# Patient Record
Sex: Male | Born: 1990 | Race: White | Hispanic: No | Marital: Single | Smoking: Current every day smoker
Health system: Southern US, Community
[De-identification: ages and names within clinical notes are randomized; demographics above are authoritative.]

## PROBLEM LIST (undated history)

## (undated) ENCOUNTER — Ambulatory Visit: Admission: EM | Source: Ambulatory Visit

## (undated) DIAGNOSIS — N200 Calculus of kidney: Secondary | ICD-10-CM

## (undated) HISTORY — PX: APPENDECTOMY: SHX54

## (undated) HISTORY — PX: CIRCUMCISION: SUR203

---
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
## 2008-07-21 ENCOUNTER — Emergency Department (HOSPITAL_BASED_OUTPATIENT_CLINIC_OR_DEPARTMENT_OTHER): Admission: EM | Admit: 2008-07-21 | Discharge: 2008-07-21 | Payer: Self-pay | Admitting: Emergency Medicine

## 2008-07-21 ENCOUNTER — Ambulatory Visit (HOSPITAL_BASED_OUTPATIENT_CLINIC_OR_DEPARTMENT_OTHER): Admission: RE | Admit: 2008-07-21 | Discharge: 2008-07-21 | Payer: Self-pay | Admitting: Family Medicine

## 2008-12-17 ENCOUNTER — Emergency Department (HOSPITAL_COMMUNITY): Admission: EM | Admit: 2008-12-17 | Discharge: 2008-12-17 | Payer: Self-pay | Admitting: Emergency Medicine

## 2010-01-28 ENCOUNTER — Emergency Department (HOSPITAL_BASED_OUTPATIENT_CLINIC_OR_DEPARTMENT_OTHER): Admission: EM | Admit: 2010-01-28 | Discharge: 2010-01-29 | Payer: Self-pay | Admitting: Emergency Medicine

## 2010-01-28 ENCOUNTER — Ambulatory Visit: Payer: Self-pay | Admitting: Diagnostic Radiology

## 2011-01-15 ENCOUNTER — Emergency Department (HOSPITAL_BASED_OUTPATIENT_CLINIC_OR_DEPARTMENT_OTHER)
Admission: EM | Admit: 2011-01-15 | Discharge: 2011-01-15 | Payer: Self-pay | Source: Home / Self Care | Admitting: Emergency Medicine

## 2011-01-17 LAB — DIFFERENTIAL
Basophils Absolute: 0.1 10*3/uL (ref 0.0–0.1)
Lymphocytes Relative: 17 % (ref 12–46)
Lymphs Abs: 1.9 10*3/uL (ref 0.7–4.0)
Monocytes Absolute: 0.8 10*3/uL (ref 0.1–1.0)
Monocytes Relative: 7 % (ref 3–12)
Neutro Abs: 8.2 10*3/uL — ABNORMAL HIGH (ref 1.7–7.7)

## 2011-01-17 LAB — COMPREHENSIVE METABOLIC PANEL
ALT: 28 U/L (ref 0–53)
Alkaline Phosphatase: 87 U/L (ref 39–117)
BUN: 14 mg/dL (ref 6–23)
CO2: 25 mEq/L (ref 19–32)
Calcium: 9.8 mg/dL (ref 8.4–10.5)
GFR calc non Af Amer: >60 mL/min (ref >60)
Glucose, Bld: 115 mg/dL — ABNORMAL HIGH (ref 70–99)
Sodium: 144 mEq/L (ref 135–145)

## 2011-01-17 LAB — CBC
HCT: 46.5 % (ref 39.0–52.0)
Hemoglobin: 16.2 g/dL (ref 13.0–17.0)
MCHC: 34.8 g/dL (ref 30.0–36.0)
MCV: 83.3 fL (ref 78.0–100.0)

## 2011-01-17 LAB — URINE MICROSCOPIC-ADD ON

## 2011-01-17 LAB — URINALYSIS, ROUTINE W REFLEX MICROSCOPIC
Ketones, ur: NEGATIVE mg/dL
Leukocytes, UA: NEGATIVE
Nitrite: NEGATIVE
Protein, ur: NEGATIVE mg/dL
Urobilinogen, UA: 0.2 mg/dL (ref 0.0–1.0)

## 2011-01-17 LAB — LIPASE, BLOOD: Lipase: 31 U/L (ref 23–300)

## 2011-01-18 LAB — OVA AND PARASITE EXAMINATION

## 2011-01-19 LAB — STOOL CULTURE

## 2011-03-15 LAB — DIFFERENTIAL
Eosinophils Relative: 8 % — ABNORMAL HIGH (ref 0–5)
Lymphocytes Relative: 46 % (ref 12–46)
Lymphs Abs: 2.9 10*3/uL (ref 0.7–4.0)
Monocytes Absolute: 0.7 10*3/uL (ref 0.1–1.0)
Monocytes Relative: 10 % (ref 3–12)

## 2011-03-15 LAB — CBC
HCT: 44.1 % (ref 39.0–52.0)
Hemoglobin: 15.4 g/dL (ref 13.0–17.0)
RBC: 5.03 MIL/uL (ref 4.22–5.81)
WBC: 6.3 10*3/uL (ref 4.0–10.5)

## 2011-03-15 LAB — POCT CARDIAC MARKERS: Troponin i, poc: <0.05 ng/mL (ref 0.00–0.09)

## 2011-03-15 LAB — BASIC METABOLIC PANEL
GFR calc non Af Amer: >60 mL/min (ref >60)
Potassium: 3.9 mEq/L (ref 3.5–5.1)
Sodium: 143 mEq/L (ref 135–145)
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
## 2011-09-22 LAB — DIFFERENTIAL
Basophils Absolute: 0
Basophils Relative: 1
Eosinophils Absolute: 0.2
Eosinophils Relative: 5
Lymphocytes Relative: 25
Lymphs Abs: 1.1
Monocytes Absolute: 0.7
Monocytes Relative: 16 — ABNORMAL HIGH
Neutro Abs: 2.3
Neutrophils Relative %: 54

## 2011-09-22 LAB — BASIC METABOLIC PANEL
Chloride: 102
Creatinine, Ser: 1
GFR calc Af Amer: >60
Potassium: 4

## 2011-09-22 LAB — URINALYSIS, ROUTINE W REFLEX MICROSCOPIC
Bilirubin Urine: NEGATIVE
Glucose, UA: NEGATIVE
Hgb urine dipstick: NEGATIVE
Ketones, ur: NEGATIVE
Nitrite: NEGATIVE
Protein, ur: NEGATIVE
Specific Gravity, Urine: 1.003 — ABNORMAL LOW
Urobilinogen, UA: 0.2
pH: 6.5

## 2011-09-22 LAB — BASIC METABOLIC PANEL WITH GFR
BUN: 12
CO2: 27
Calcium: 9.7
GFR calc non Af Amer: >60
Glucose, Bld: 86
Sodium: 138

## 2011-09-22 LAB — CBC
HCT: 44.5
Hemoglobin: 15.3
MCHC: 34.4
MCV: 86.8
Platelets: 169
RBC: 5.13
RDW: 12.7
WBC: 4.3

## 2011-11-11 ENCOUNTER — Encounter: Payer: Self-pay | Admitting: *Deleted

## 2011-11-11 ENCOUNTER — Emergency Department (HOSPITAL_BASED_OUTPATIENT_CLINIC_OR_DEPARTMENT_OTHER)
Admission: EM | Admit: 2011-11-11 | Discharge: 2011-11-11 | Disposition: A | Discharge status: Home / Self Care | Payer: BC Managed Care – PPO | Attending: Emergency Medicine | Admitting: Emergency Medicine

## 2011-11-11 DIAGNOSIS — H6690 Otitis media, unspecified, unspecified ear: Secondary | ICD-10-CM | POA: Insufficient documentation

## 2011-11-11 DIAGNOSIS — R112 Nausea with vomiting, unspecified: Secondary | ICD-10-CM | POA: Insufficient documentation

## 2011-11-11 DIAGNOSIS — H6692 Otitis media, unspecified, left ear: Secondary | ICD-10-CM

## 2011-11-11 DIAGNOSIS — H5710 Ocular pain, unspecified eye: Secondary | ICD-10-CM | POA: Insufficient documentation

## 2011-11-11 DIAGNOSIS — J029 Acute pharyngitis, unspecified: Secondary | ICD-10-CM | POA: Insufficient documentation

## 2011-11-11 MED ORDER — AMOXICILLIN 500 MG PO CAPS: 500.0000 mg | ORAL_CAPSULE | Freq: Three times a day (TID) | ORAL | Status: AC

## 2011-11-11 NOTE — ED Provider Notes (Signed)
History  Scribed for Carleene Cooper III, MD, the patient was seen in [facility]04/[facility]04. The chart was scribed by Gilman Schmidt. The patients care was started at 7:27 PM.   CSN: [id number] Arrival date & time: 11/11/2011  6:45 PM   First MD Initiated Contact with Patient 11/11/11 1911      Chief Complaint  Patient presents with  . Influenza   HPI [facility] is a 20 y.o. male who presents to the Emergency Department complaining of influenza. Pt reports flu-like symptoms onset two days ago. Reports generalized weakness, chills, vomiting (3x), sore throat, headache, stiff neck, eye pain, fever (101.3), and cough. Denies any diarrhea, dysuria, syncope, or seizures. Pt has taken Nyquil with no relief. Pt is allergic to steroids. There are no other associated symptoms and no other alleviating or aggravating factors.  PCP: Dr Kathrynn [facility]   History reviewed. No pertinent past medical history.  Past Surgical History  Procedure Date  . Appendectomy   . Circumcision     History reviewed. No pertinent family history.  History  Substance Use Topics  . Smoking status: Never Smoker   . Smokeless tobacco: Not on file  . Alcohol Use: No      Review of Systems  Constitutional: Positive for fever, chills and diaphoresis.  HENT: Positive for ear pain, sore throat and neck stiffness.   Eyes: Positive for pain.  Respiratory: Positive for cough. Negative for shortness of breath.   Cardiovascular: Negative for chest pain.  Gastrointestinal: Positive for nausea and vomiting. Negative for diarrhea.  Genitourinary: Negative for dysuria.  Neurological: Positive for weakness and headaches.  All other systems reviewed and are negative.    Allergies  Prednisone  Home Medications   Current Outpatient Rx  Name Route Sig Dispense Refill  . IBUPROFEN 200 MG PO TABS Oral Take 400 mg by mouth every 6 (six) hours as needed. For headache       BP 114/67  Pulse 84  Temp(Src) 98.5 F (36.9 C)  (Oral)  Resp 18  Ht 5\' 11"  (1.803 m)  Wt 150 lb (68.04 kg)  BMI 20.92 kg/m2  SpO2 100%  Physical Exam  Nursing note and vitals reviewed. Constitutional: He is oriented to person, place, and time. He appears well-developed and well-nourished.  Non-toxic appearance. He does not have a sickly appearance.  HENT:  Head: Normocephalic and atraumatic.  Left Ear: Tympanic membrane is erythematous.  Mouth/Throat: Posterior oropharyngeal erythema present.       Tongue coated  Eyes: Conjunctivae, EOM and lids are normal. Pupils are equal, round, and reactive to light.  Neck: Trachea normal, normal range of motion and full passive range of motion without pain. Neck supple.       Bilateral cervical adenopathy in anterior neck  Cardiovascular: Normal rate, regular rhythm and normal heart sounds.   Pulmonary/Chest: Effort normal and breath sounds normal. No respiratory distress.  Abdominal: Soft. Normal appearance and bowel sounds are normal. He exhibits no distension. There is no tenderness. There is no rebound and no CVA tenderness.  Musculoskeletal: Normal range of motion.       No point tenderness over muscles  Neurological: He is alert and oriented to person, place, and time. He has normal strength.       Deep tendon reflexes normal  Skin: Skin is warm, dry and intact. No rash noted.    ED Course  Procedures  DIAGNOSTIC STUDIES: Oxygen Saturation is 100% on room air, normal by my interpretation.  COORDINATION OF CARE: 7:27pm:  - Patient evaluated by ED physician   IMP:  Left otitis media.  Disp:  Amoxicillin 500 mg tid x 10 days.  Tylenol if needed for fever.   I personally performed the services described in this documentation, which was scribed in my presence. The recorded information has been reviewed and considered.  Osvaldo Human, M.D.       Carleene Cooper III, MD 11/11/11 [phone]

## 2011-11-11 NOTE — ED Notes (Signed)
Pt states he has had flu-like s/s since yesterday. (H/A, body aches, chills, fever, N/V)

## 2012-01-04 ENCOUNTER — Emergency Department (HOSPITAL_BASED_OUTPATIENT_CLINIC_OR_DEPARTMENT_OTHER)
Admission: EM | Admit: 2012-01-04 | Discharge: 2012-01-04 | Disposition: A | Discharge status: Home / Self Care | Payer: BC Managed Care – PPO | Attending: Emergency Medicine | Admitting: Emergency Medicine

## 2012-01-04 ENCOUNTER — Emergency Department (INDEPENDENT_AMBULATORY_CARE_PROVIDER_SITE_OTHER): Payer: BC Managed Care – PPO

## 2012-01-04 ENCOUNTER — Encounter (HOSPITAL_BASED_OUTPATIENT_CLINIC_OR_DEPARTMENT_OTHER): Payer: Self-pay | Admitting: *Deleted

## 2012-01-04 DIAGNOSIS — F17200 Nicotine dependence, unspecified, uncomplicated: Secondary | ICD-10-CM | POA: Insufficient documentation

## 2012-01-04 DIAGNOSIS — R059 Cough, unspecified: Secondary | ICD-10-CM | POA: Insufficient documentation

## 2012-01-04 DIAGNOSIS — R0781 Pleurodynia: Secondary | ICD-10-CM

## 2012-01-04 DIAGNOSIS — R05 Cough: Secondary | ICD-10-CM

## 2012-01-04 DIAGNOSIS — R079 Chest pain, unspecified: Secondary | ICD-10-CM

## 2012-01-04 DIAGNOSIS — R071 Chest pain on breathing: Secondary | ICD-10-CM | POA: Insufficient documentation

## 2012-01-04 MED ORDER — HYDROCODONE-ACETAMINOPHEN 5-500 MG PO TABS: 1.0000 | ORAL_TABLET | Freq: Four times a day (QID) | ORAL | Status: AC | PRN

## 2012-01-04 NOTE — ED Provider Notes (Signed)
History     CSN: [id number]  Arrival date & time 01/04/12  0865   First MD Initiated Contact with Patient 01/04/12 1026      Chief Complaint  Patient presents with  . Cough  . Pleurisy    (Consider location/radiation/quality/duration/timing/severity/associated sxs/prior treatment) HPI Comments: Pain is pleuritic a 10 out of 10. Worse with coughing, deep breathing, movement and palpation. Pain does not radiate.  Patient is a 21 y.o. male presenting with cough. The history is provided by the patient.  Cough This is a new problem. The current episode started more than 1 week ago. The problem occurs constantly. The problem has not changed since onset.The cough is non-productive. There has been no fever. Associated symptoms include chest pain. Pertinent negatives include no ear congestion, no rhinorrhea, no sore throat, no shortness of breath and no wheezing. He has tried nothing for the symptoms. The treatment provided no relief. He is a smoker. His past medical history does not include bronchitis or asthma.    History reviewed. No pertinent past medical history.  Past Surgical History  Procedure Date  . Appendectomy   . Circumcision     History reviewed. No pertinent family history.  History  Substance Use Topics  . Smoking status: Current Everyday Smoker -- 0.5 packs/day  . Smokeless tobacco: Not on file  . Alcohol Use: No      Review of Systems  HENT: Negative for sore throat and rhinorrhea.   Respiratory: Positive for cough. Negative for shortness of breath and wheezing.   Cardiovascular: Positive for chest pain.  Gastrointestinal: Negative for nausea, vomiting, abdominal pain and diarrhea.  All other systems reviewed and are negative.    Allergies  Prednisone  Home Medications   Current Outpatient Rx  Name Route Sig Dispense Refill  . IBUPROFEN 200 MG PO TABS Oral Take 400 mg by mouth every 6 (six) hours as needed. For headache       BP 103/67  Pulse 88   Temp(Src) 98 F (36.7 C) (Oral)  Resp 18  Ht 5\' 11"  (1.803 m)  Wt 150 lb (68.04 kg)  BMI 20.92 kg/m2  SpO2 100%  Physical Exam  Nursing note and vitals reviewed. Constitutional: He is oriented to person, place, and time. He appears well-developed and well-nourished. No distress.  HENT:  Head: Normocephalic and atraumatic.  Mouth/Throat: Oropharynx is clear and moist.  Eyes: Conjunctivae and EOM are normal. Pupils are equal, round, and reactive to light.  Neck: Normal range of motion. Neck supple.  Cardiovascular: Normal rate, regular rhythm and intact distal pulses.   No murmur heard. Pulmonary/Chest: Effort normal and breath sounds normal. No respiratory distress. He has no wheezes. He has no rales. He exhibits tenderness.    Abdominal: Soft. He exhibits no distension. There is no tenderness. There is no rebound and no guarding.  Musculoskeletal: Normal range of motion. He exhibits no edema and no tenderness.  Neurological: He is alert and oriented to person, place, and time.  Skin: Skin is warm and dry. No rash noted. No erythema.  Psychiatric: He has a normal mood and affect. His behavior is normal.    ED Course  Procedures (including critical care time)  Labs Reviewed - No data to display Dg Chest 2 View  01/04/2012  *RADIOLOGY REPORT*  Clinical Data: Chest pain, cough.  CHEST - 2 VIEW  Comparison: 01/28/2010  Findings: Heart and mediastinal contours are within normal limits. No focal opacities or effusions.  No acute bony abnormality.  IMPRESSION: No active cardiopulmonary disease.  Original Report Authenticated By: Cyndie Chime, M.D.     No diagnosis found.    MDM   Patient with pleuritic chest pain most consistent with pleurisy versus muscle strain. Patient is a smoker who is not had worsening cough or symptoms of bronchitis. No component concerning for cardiac origin.  Equal bilateral breath sounds  without concern for pneumothorax. Chest x-ray negative for  acute disease.  Normal vital signs and 100% oxygen saturation on room air Will give supportive care.        Gwyneth Sprout, MD 01/04/12 1205

## 2012-01-04 NOTE — ED Notes (Signed)
Cough states had soreness in chest area for 2 weeks mainly when he coughs or sneezes chest wall is tender /sore to palpation

## 2013-12-12 ENCOUNTER — Emergency Department (HOSPITAL_BASED_OUTPATIENT_CLINIC_OR_DEPARTMENT_OTHER)
Admission: EM | Admit: 2013-12-12 | Discharge: 2013-12-12 | Disposition: A | Discharge status: Home / Self Care | Payer: BC Managed Care – PPO | Attending: Emergency Medicine | Admitting: Emergency Medicine

## 2013-12-12 ENCOUNTER — Encounter (HOSPITAL_BASED_OUTPATIENT_CLINIC_OR_DEPARTMENT_OTHER): Payer: Self-pay | Admitting: Emergency Medicine

## 2013-12-12 DIAGNOSIS — F17200 Nicotine dependence, unspecified, uncomplicated: Secondary | ICD-10-CM | POA: Insufficient documentation

## 2013-12-12 DIAGNOSIS — Z9089 Acquired absence of other organs: Secondary | ICD-10-CM | POA: Insufficient documentation

## 2013-12-12 DIAGNOSIS — R1013 Epigastric pain: Secondary | ICD-10-CM | POA: Insufficient documentation

## 2013-12-12 DIAGNOSIS — R109 Unspecified abdominal pain: Secondary | ICD-10-CM

## 2013-12-12 DIAGNOSIS — Z412 Encounter for routine and ritual male circumcision: Secondary | ICD-10-CM | POA: Insufficient documentation

## 2013-12-12 LAB — CBC WITH DIFFERENTIAL/PLATELET
Basophils Relative: 1 % (ref 0–1)
HCT: 44.2 % (ref 39.0–52.0)
Hemoglobin: 15.2 g/dL (ref 13.0–17.0)
Lymphs Abs: 2.5 10*3/uL (ref 0.7–4.0)
MCH: 29.7 pg (ref 26.0–34.0)
MCHC: 34.4 g/dL (ref 30.0–36.0)
Monocytes Absolute: 0.8 10*3/uL (ref 0.1–1.0)
Monocytes Relative: 8 % (ref 3–12)
Neutro Abs: 6 10*3/uL (ref 1.7–7.7)
RBC: 5.11 MIL/uL (ref 4.22–5.81)

## 2013-12-12 LAB — COMPREHENSIVE METABOLIC PANEL
ALT: 12 U/L (ref 0–53)
AST: 16 U/L (ref 0–37)
Calcium: 9.4 mg/dL (ref 8.4–10.5)
Creatinine, Ser: 1 mg/dL (ref 0.50–1.35)
GFR calc Af Amer: >90 mL/min (ref >90)
Sodium: 140 mEq/L (ref 135–145)
Total Protein: 7.2 g/dL (ref 6.0–8.3)

## 2013-12-12 LAB — URINE MICROSCOPIC-ADD ON

## 2013-12-12 LAB — URINALYSIS, ROUTINE W REFLEX MICROSCOPIC
Glucose, UA: NEGATIVE mg/dL
Specific Gravity, Urine: 1.009 (ref 1.005–1.030)
pH: 7.5 (ref 5.0–8.0)

## 2013-12-12 NOTE — ED Provider Notes (Signed)
CSN: [id number]     Arrival date & time 12/12/13  1840 History   First MD Initiated Contact with Patient 12/12/13 1901     Chief Complaint  Patient presents with  . Abdominal Pain   (Consider location/radiation/quality/duration/timing/severity/associated sxs/prior Treatment) HPI Complaint of abdominal pain epigastric in location, nonradiating, sharp constant waxes and wanes onset 2 weeks ago after he lifted heavy object. Pain is worse with changing positions and worse with eating. Improved with remaining still No nausea no vomiting no fever no blood per rectum last bowel movement yesterday, normal. No other associated symptoms. Treated with ibuprofen with partial relief. History reviewed. No pertinent past medical history. Past Surgical History  Procedure Laterality Date  . Appendectomy    . Circumcision     No family history on file. History  Substance Use Topics  . Smoking status: Current Every Day Smoker -- 0.50 packs/day  . Smokeless tobacco: Not on file  . Alcohol Use: No    Review of Systems  Constitutional: Negative.   HENT: Negative.   Respiratory: Negative.   Cardiovascular: Negative.   Gastrointestinal: Positive for abdominal pain.  Musculoskeletal: Negative.   Skin: Negative.   Neurological: Negative.   Psychiatric/Behavioral: Negative.   All other systems reviewed and are negative.    Allergies  Prednisone  Home Medications   Current Outpatient Rx  Name  Route  Sig  Dispense  Refill  . ibuprofen (ADVIL,MOTRIN) 200 MG tablet   Oral   Take 400 mg by mouth every 6 (six) hours as needed. For headache           BP 127/73  Pulse 71  Temp(Src) 98.1 F (36.7 C) (Oral)  Resp 18  Ht 5\' 11"  (1.803 m)  Wt 150 lb (68.04 kg)  BMI 20.93 kg/m2  SpO2 100% Physical Exam  Nursing note and vitals reviewed. Constitutional: He appears well-developed and well-nourished.  HENT:  Head: Normocephalic and atraumatic.  Eyes: Conjunctivae are normal. Pupils are  equal, round, and reactive to light.  Neck: Neck supple. No tracheal deviation present. No thyromegaly present.  Cardiovascular: Normal rate and regular rhythm.   No murmur heard. Pulmonary/Chest: Effort normal and breath sounds normal.  Abdominal: Soft. Bowel sounds are normal. He exhibits no distension and no mass. There is tenderness. There is no rebound and no guarding.  Mild tenderness to at epigastrium. Negative Murphy sign.  Genitourinary: Penis normal.  Circumcised  Musculoskeletal: Normal range of motion. He exhibits no edema and no tenderness.  Neurological: He is alert. Coordination normal.  Skin: Skin is warm and dry. No rash noted.  Psychiatric: He has a normal mood and affect.    ED Course  Procedures (including critical care time) Labs Review Labs Reviewed  URINALYSIS, ROUTINE W REFLEX MICROSCOPIC   Imaging Review No results found.  EKG Interpretation   None      Declines pain medicine in the ED 8:18 PM patient resting comfortably. No distress. Results for orders placed during the hospital encounter of 12/12/13  URINALYSIS, ROUTINE W REFLEX MICROSCOPIC      Result Value Range   Color, Urine YELLOW  YELLOW   APPearance CLEAR  CLEAR   Specific Gravity, Urine 1.009  1.005 - 1.030   pH 7.5  5.0 - 8.0   Glucose, UA NEGATIVE  NEGATIVE mg/dL   Hgb urine dipstick NEGATIVE  NEGATIVE   Bilirubin Urine NEGATIVE  NEGATIVE   Ketones, ur NEGATIVE  NEGATIVE mg/dL   Protein, ur NEGATIVE  NEGATIVE mg/dL  Urobilinogen, UA 0.2  0.0 - 1.0 mg/dL   Nitrite NEGATIVE  NEGATIVE   Leukocytes, UA TRACE (*) NEGATIVE  URINE MICROSCOPIC-ADD ON      Result Value Range   Squamous Epithelial / LPF RARE  RARE   WBC, UA 0-2  <3 WBC/hpf   Bacteria, UA RARE  RARE  COMPREHENSIVE METABOLIC PANEL      Result Value Range   Sodium 140  135 - 145 mEq/L   Potassium 3.6  3.5 - 5.1 mEq/L   Chloride 102  96 - 112 mEq/L   CO2 25  19 - 32 mEq/L   Glucose, Bld 80  70 - 99 mg/dL   BUN 11  6 -  23 mg/dL   Creatinine, Ser 1.61  0.50 - 1.35 mg/dL   Calcium 9.4  8.4 - 09.6 mg/dL   Total Protein 7.2  6.0 - 8.3 g/dL   Albumin 4.8  3.5 - 5.2 g/dL   AST 16  0 - 37 U/L   ALT 12  0 - 53 U/L   Alkaline Phosphatase 69  39 - 117 U/L   Total Bilirubin 0.6  0.3 - 1.2 mg/dL   GFR calc non Af Amer >90  >90 mL/min   GFR calc Af Amer >90  >90 mL/min  CBC WITH DIFFERENTIAL      Result Value Range   WBC 9.4  4.0 - 10.5 K/uL   RBC 5.11  4.22 - 5.81 MIL/uL   Hemoglobin 15.2  13.0 - 17.0 g/dL   HCT 04.5  40.9 - 81.1 %   MCV 86.5  78.0 - 100.0 fL   MCH 29.7  26.0 - 34.0 pg   MCHC 34.4  30.0 - 36.0 g/dL   RDW 91.4  78.2 - 95.6 %   Platelets 203  150 - 400 K/uL   Neutrophils Relative % 63  43 - 77 %   Neutro Abs 6.0  1.7 - 7.7 K/uL   Lymphocytes Relative 27  12 - 46 %   Lymphs Abs 2.5  0.7 - 4.0 K/uL   Monocytes Relative 8  3 - 12 %   Monocytes Absolute 0.8  0.1 - 1.0 K/uL   Eosinophils Relative 2  0 - 5 %   Eosinophils Absolute 0.1  0.0 - 0.7 K/uL   Basophils Relative 1  0 - 1 %   Basophils Absolute 0.1  0.0 - 0.1 K/uL  LIPASE, BLOOD      Result Value Range   Lipase 18  11 - 59 U/L   No results found.  MDM  No diagnosis found. Patient has both a musculoskeletal component and ato gastrointestinal  his pain. Suggest Tylenol for pain avoid ibuprofen. Maalox 2 tablespoons after meals and bedtime. Referral to Dr. Rhea Belton, gastroenterologist if not better in one or 2 weeks and referral to resource guide to get primary care physician   diagnosis abdominal pain    Doug Sou, MD 12/12/13 2022

## 2013-12-12 NOTE — ED Notes (Signed)
Abdominal pain x 2 weeks. Pain is worse after eating lunch.

## 2014-05-09 ENCOUNTER — Emergency Department (HOSPITAL_COMMUNITY): Payer: BC Managed Care – PPO

## 2014-05-09 ENCOUNTER — Emergency Department (HOSPITAL_COMMUNITY)
Admission: EM | Admit: 2014-05-09 | Discharge: 2014-05-09 | Disposition: A | Discharge status: Home / Self Care | Payer: BC Managed Care – PPO | Attending: Emergency Medicine | Admitting: Emergency Medicine

## 2014-05-09 ENCOUNTER — Encounter (HOSPITAL_COMMUNITY): Payer: Self-pay | Admitting: Emergency Medicine

## 2014-05-09 DIAGNOSIS — Y92410 Unspecified street and highway as the place of occurrence of the external cause: Secondary | ICD-10-CM | POA: Insufficient documentation

## 2014-05-09 DIAGNOSIS — T07XXXA Unspecified multiple injuries, initial encounter: Secondary | ICD-10-CM

## 2014-05-09 DIAGNOSIS — Y9389 Activity, other specified: Secondary | ICD-10-CM | POA: Insufficient documentation

## 2014-05-09 DIAGNOSIS — Y998 Other external cause status: Secondary | ICD-10-CM | POA: Insufficient documentation

## 2014-05-09 DIAGNOSIS — S51809A Unspecified open wound of unspecified forearm, initial encounter: Secondary | ICD-10-CM | POA: Insufficient documentation

## 2014-05-09 DIAGNOSIS — S51812A Laceration without foreign body of left forearm, initial encounter: Secondary | ICD-10-CM

## 2014-05-09 DIAGNOSIS — F17200 Nicotine dependence, unspecified, uncomplicated: Secondary | ICD-10-CM | POA: Insufficient documentation

## 2014-05-09 DIAGNOSIS — IMO0002 Reserved for concepts with insufficient information to code with codable children: Secondary | ICD-10-CM | POA: Insufficient documentation

## 2014-05-09 DIAGNOSIS — Z23 Encounter for immunization: Secondary | ICD-10-CM | POA: Insufficient documentation

## 2014-05-09 DIAGNOSIS — S60219A Contusion of unspecified wrist, initial encounter: Secondary | ICD-10-CM | POA: Insufficient documentation

## 2014-05-09 LAB — I-STAT CHEM 8, ED
BUN: 13 mg/dL (ref 6–23)
Calcium, Ion: 1.15 mmol/L (ref 1.12–1.23)
Chloride: 103 mEq/L (ref 96–112)
Creatinine, Ser: 1 mg/dL (ref 0.50–1.35)
GLUCOSE: 98 mg/dL (ref 70–99)
HEMATOCRIT: 48 % (ref 39.0–52.0)
HEMOGLOBIN: 16.3 g/dL (ref 13.0–17.0)
Potassium: 3.1 mEq/L — ABNORMAL LOW (ref 3.7–5.3)
SODIUM: 142 meq/L (ref 137–147)
TCO2: 24 mmol/L (ref 0–100)

## 2014-05-09 LAB — ETHANOL: Alcohol, Ethyl (B): <11 mg/dL (ref 0–11)

## 2014-05-09 MED ORDER — KETOROLAC TROMETHAMINE 30 MG/ML IJ SOLN
30.0000 mg | Freq: Once | INTRAMUSCULAR | Status: AC
  Administered 2014-05-09: 30 mg via INTRAVENOUS
  Filled 2014-05-09: qty 1

## 2014-05-09 MED ORDER — FENTANYL CITRATE 0.05 MG/ML IJ SOLN: 50.0000 ug | Freq: Once | INTRAMUSCULAR | Status: DC

## 2014-05-09 MED ORDER — SODIUM CHLORIDE 0.9 % IV BOLUS (SEPSIS)
1000.0000 mL | Freq: Once | INTRAVENOUS | Status: AC
  Administered 2014-05-09: 1000 mL via INTRAVENOUS

## 2014-05-09 MED ORDER — POTASSIUM CHLORIDE CRYS ER 20 MEQ PO TBCR
40.0000 meq | EXTENDED_RELEASE_TABLET | Freq: Once | ORAL | Status: AC
  Administered 2014-05-09: 40 meq via ORAL
  Filled 2014-05-09: qty 2

## 2014-05-09 MED ORDER — HYDROCODONE-ACETAMINOPHEN 5-325 MG PO TABS: 1.0000 | ORAL_TABLET | ORAL | Status: DC | PRN

## 2014-05-09 MED ORDER — MORPHINE SULFATE 4 MG/ML IJ SOLN
4.0000 mg | Freq: Once | INTRAMUSCULAR | Status: AC
  Administered 2014-05-09: 4 mg via INTRAVENOUS
  Filled 2014-05-09: qty 1

## 2014-05-09 MED ORDER — TETANUS-DIPHTH-ACELL PERTUSSIS 5-2.5-18.5 LF-MCG/0.5 IM SUSP
0.5000 mL | Freq: Once | INTRAMUSCULAR | Status: AC
  Administered 2014-05-09: 0.5 mL via INTRAMUSCULAR
  Filled 2014-05-09: qty 0.5

## 2014-05-09 NOTE — ED Notes (Signed)
Dr. Zebedee IbaSchinlever At Bedside

## 2014-05-09 NOTE — ED Notes (Signed)
Pt c-collar removed by EDP [place].

## 2014-05-09 NOTE — Discharge Instructions (Signed)
Take vicodin as prescribed for severe pain.   Do not drive within four hours of taking this medication (may cause drowsiness or confusion).  Take ibuprofen as well; up to 800mg  three times a day with food.  Keep wounds clean and dry.  [facility] your primary care doctor [facility] [phone]; [address]) in 7-10 days for wound recheck and suture removal.  You should be seen sooner if you develop fever, worsening pain or redness/drainage of pus at site of wound.
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
## 2014-05-09 NOTE — ED Notes (Signed)
Pt has skin tear to left forearm, tear is approx 3mm deep and inches long. Dressing applied and bleeding controlled.
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
## 2014-07-26 ENCOUNTER — Emergency Department (HOSPITAL_BASED_OUTPATIENT_CLINIC_OR_DEPARTMENT_OTHER)
Admission: EM | Admit: 2014-07-26 | Discharge: 2014-07-26 | Disposition: A | Discharge status: Home / Self Care | Payer: BC Managed Care – PPO | Attending: Emergency Medicine | Admitting: Emergency Medicine

## 2014-07-26 ENCOUNTER — Encounter (HOSPITAL_BASED_OUTPATIENT_CLINIC_OR_DEPARTMENT_OTHER): Payer: Self-pay | Admitting: Emergency Medicine

## 2014-07-26 DIAGNOSIS — Z87891 Personal history of nicotine dependence: Secondary | ICD-10-CM | POA: Insufficient documentation

## 2014-07-26 DIAGNOSIS — R112 Nausea with vomiting, unspecified: Secondary | ICD-10-CM | POA: Insufficient documentation

## 2014-07-26 DIAGNOSIS — N390 Urinary tract infection, site not specified: Secondary | ICD-10-CM

## 2014-07-26 DIAGNOSIS — R109 Unspecified abdominal pain: Secondary | ICD-10-CM | POA: Insufficient documentation

## 2014-07-26 DIAGNOSIS — Z9089 Acquired absence of other organs: Secondary | ICD-10-CM | POA: Insufficient documentation

## 2014-07-26 DIAGNOSIS — Z87442 Personal history of urinary calculi: Secondary | ICD-10-CM | POA: Insufficient documentation

## 2014-07-26 LAB — URINE MICROSCOPIC-ADD ON

## 2014-07-26 LAB — URINALYSIS, ROUTINE W REFLEX MICROSCOPIC
Bilirubin Urine: NEGATIVE
Glucose, UA: NEGATIVE mg/dL
KETONES UR: NEGATIVE mg/dL
Leukocytes, UA: NEGATIVE
Nitrite: NEGATIVE
PROTEIN: NEGATIVE mg/dL
Specific Gravity, Urine: 1.014 (ref 1.005–1.030)
Urobilinogen, UA: 0.2 mg/dL (ref 0.0–1.0)
pH: 7.5 (ref 5.0–8.0)

## 2014-07-26 MED ORDER — CEPHALEXIN 500 MG PO CAPS: 500.0000 mg | ORAL_CAPSULE | Freq: Four times a day (QID) | ORAL | Status: DC

## 2014-07-26 MED ORDER — DEXTROSE 5 % IV SOLN
1.0000 g | Freq: Once | INTRAVENOUS | Status: AC
  Administered 2014-07-26: 1 g via INTRAVENOUS

## 2014-07-26 MED ORDER — IBUPROFEN 600 MG PO TABS: 600.0000 mg | ORAL_TABLET | Freq: Four times a day (QID) | ORAL | Status: DC | PRN

## 2014-07-26 MED ORDER — OXYCODONE-ACETAMINOPHEN 5-325 MG PO TABS
1.0000 | ORAL_TABLET | Freq: Once | ORAL | Status: AC
  Administered 2014-07-26: 1 via ORAL
  Filled 2014-07-26: qty 1

## 2014-07-26 MED ORDER — OXYCODONE-ACETAMINOPHEN 5-325 MG PO TABS: 1.0000 | ORAL_TABLET | ORAL | Status: DC | PRN

## 2014-07-26 MED ORDER — ONDANSETRON HCL 4 MG/2ML IJ SOLN
4.0000 mg | Freq: Once | INTRAMUSCULAR | Status: AC
  Administered 2014-07-26: 4 mg via INTRAVENOUS
  Filled 2014-07-26: qty 2

## 2014-07-26 MED ORDER — KETOROLAC TROMETHAMINE 30 MG/ML IJ SOLN
30.0000 mg | Freq: Once | INTRAMUSCULAR | Status: AC
  Administered 2014-07-26: 30 mg via INTRAVENOUS
  Filled 2014-07-26: qty 1

## 2014-07-26 MED ORDER — CEFTRIAXONE SODIUM 1 G IJ SOLR
INTRAMUSCULAR | Status: AC
  Filled 2014-07-26: qty 10

## 2014-07-26 MED ORDER — OXYCODONE-ACETAMINOPHEN 5-325 MG PO TABS: 1.0000 | ORAL_TABLET | Freq: Once | ORAL | Status: DC

## 2014-07-26 MED ORDER — HYDROMORPHONE HCL PF 1 MG/ML IJ SOLN
1.0000 mg | Freq: Once | INTRAMUSCULAR | Status: AC
  Administered 2014-07-26: 1 mg via INTRAVENOUS
  Filled 2014-07-26: qty 1

## 2014-07-26 NOTE — Discharge Instructions (Signed)
Flank Pain °Flank pain refers to pain that is located on the side of the body between the upper abdomen and the back. The pain may occur over a short period of time (acute) or may be long-term or reoccurring (chronic). It may be mild or severe. Flank pain can be caused by many things. °CAUSES  °Some of the more common causes of flank pain include: °· Muscle strains.   °· Muscle spasms.   °· A disease of your spine (vertebral disk disease).   °· A lung infection (pneumonia).   °· Fluid around your lungs (pulmonary edema).   °· A kidney infection.   °· Kidney stones.   °· A very painful skin rash caused by the chickenpox virus (shingles).   °· Gallbladder disease.   °HOME CARE INSTRUCTIONS  °Home care will depend on the cause of your pain. In general, °· Rest as directed by your caregiver. °· Drink enough fluids to keep your urine clear or pale yellow. °· Only take over-the-counter or prescription medicines as directed by your caregiver. Some medicines may help relieve the pain. °· Tell your caregiver about any changes in your pain. °· Follow up with your caregiver as directed. °SEEK IMMEDIATE MEDICAL CARE IF:  °· Your pain is not controlled with medicine.   °· You have new or worsening symptoms. °· Your pain increases.   °· You have abdominal pain.   °· You have shortness of breath.   °· You have persistent nausea or vomiting.   °· You have swelling in your abdomen.   °· You feel faint or pass out.   °· You have blood in your urine. °· You have a fever or persistent symptoms for more than 2-3 days. °· You have a fever and your symptoms suddenly get worse. °MAKE SURE YOU:  °· Understand these instructions. °· Will watch your condition. °· Will get help right away if you are not doing well or get worse. °Document Released: 02/01/2006 Document Revised: 09/04/2012 Document Reviewed: 07/25/2012 °[employer]® Patient Information ©2015 [employer]. This information is not intended to replace advice given to you by your  health care provider. Make sure you discuss any questions you have with your health care provider. ° °

## 2014-07-26 NOTE — ED Notes (Signed)
Pt reports 1 hour pta with pain in L flank,dysuria and blood in urine, n/v.  Reports hx of kidney stones.

## 2014-07-26 NOTE — ED Provider Notes (Signed)
CSN: [id number]     Arrival date & time 07/26/14  1522 History  This chart was scribed for Raeford Razor, MD by Phillis Haggis, ED Scribe. This patient was seen in room MH01/MH01 and patient care was started at 4:04 PM.     Chief Complaint  Patient presents with  . Flank Pain   Patient is a 23 y.o. male presenting with flank pain. The history is provided by the patient. No language interpreter was used.  Flank Pain Associated symptoms include abdominal pain.   HPI Comments: Sean Cruz is a 23 y.o. male with a history of kidney stones who presents to the Emergency Department complaining of constant right flank pain onset two hours ago. Patient reports associated dysuria, vomiting, nausea, sharp pain in right testicle and lower abdomen, and hematuria. He reports associated chills after vomiting upon arrival. He states that this is the third incident with similar symptoms. He states that he woke up this morning with no symptoms. He states that pain is worsened with movement and relieved when he puts pressure on his abdomen. Reports allergies to prednisone. He denies fever. He denies taking anything for the pain. He denies history of abdominal surgery except for appendectomy when he was 13.    History reviewed. No pertinent past medical history. Past Surgical History  Procedure Laterality Date  . Appendectomy    . Circumcision     No family history on file. History  Substance Use Topics  . Smoking status: Former Smoker -- 0.50 packs/day  . Smokeless tobacco: Not on file  . Alcohol Use: No    Review of Systems  Constitutional: Positive for chills. Negative for fever.  Gastrointestinal: Positive for nausea, vomiting and abdominal pain.  Genitourinary: Positive for dysuria, hematuria, flank pain and testicular pain.  All other systems reviewed and are negative.  Allergies  Prednisone  Home Medications   Prior to Admission medications   Medication Sig Start Date End Date Taking?  Authorizing Provider  HYDROcodone-acetaminophen (NORCO/VICODIN) 5-325 MG per tablet Take 1 tablet by mouth every 4 (four) hours as needed for moderate pain. 05/09/14   Arie Sabina Schinlever, PA-C  ibuprofen (ADVIL,MOTRIN) 200 MG tablet Take 400 mg by mouth every 6 (six) hours as needed. For headache     Historical Provider, MD   BP 132/77  Pulse 70  Resp 21  Ht 5\' 11"  (1.803 m)  Wt 150 lb (68.04 kg)  BMI 20.93 kg/m2  SpO2 100% Physical Exam  Nursing note and vitals reviewed. Constitutional: He appears well-developed and well-nourished. No distress.  Appears uncomfortable.   HENT:  Head: Normocephalic and atraumatic.  Eyes: Conjunctivae are normal. Right eye exhibits no discharge. Left eye exhibits no discharge.  Neck: Neck supple.  Cardiovascular: Normal rate, regular rhythm and normal heart sounds.  Exam reveals no gallop and no friction rub.   No murmur heard. Pulmonary/Chest: Effort normal and breath sounds normal. No respiratory distress.  Abdominal: Soft. He exhibits no distension. There is no tenderness. There is CVA tenderness.  Right CVA tenderness.   Musculoskeletal: He exhibits no edema and no tenderness.  Neurological: He is alert.  Skin: Skin is warm and dry.  Psychiatric: He has a normal mood and affect. His behavior is normal. Thought content normal.    ED Course  Procedures (including critical care time) DIAGNOSTIC STUDIES: Oxygen Saturation is 100% on room air, normal by my interpretation.    COORDINATION OF CARE: 4:08 PM-Discussed treatment plan which includes Ct scan and pain medication  with pt at bedside and pt agreed to plan.   Labs Review Labs Reviewed  URINALYSIS, ROUTINE W REFLEX MICROSCOPIC - Abnormal; Notable for the following:    APPearance CLOUDY (*)    Hgb urine dipstick LARGE (*)    All other components within normal limits  URINE MICROSCOPIC-ADD ON - Abnormal; Notable for the following:    Bacteria, UA MANY (*)    All other components within  normal limits  URINE CULTURE   Imaging Review No results found.   EKG Interpretation None      MDM   Final diagnoses:  Flank pain  UTI (lower urinary tract infection)    23 year old male with flank pain, dysuria and hematuria. Treated presumptively for a ureteral colic. Hx of same and reports very similar symptoms previously. Imaging deferred at this time. He is s/p appendectomy. Many bacteria noted on urinalysis. We'll cover with antibiotics for possible UTI/pyelonephritis. Send urine culture. Is nontoxic. He is appropriate for outpatient treatment. Return precautions were discussed.  I personally preformed the services scribed in my presence. The recorded information has been reviewed is accurate. Raeford RazorStephen Amiylah Anastos, MD.    Raeford RazorStephen Gerrit Rafalski, MD 08/01/14 [phone]

## 2014-07-27 LAB — URINE CULTURE
Colony Count: NO GROWTH
Culture: NO GROWTH

## 2014-11-11 ENCOUNTER — Emergency Department (HOSPITAL_COMMUNITY)
Admission: EM | Admit: 2014-11-11 | Discharge: 2014-11-11 | Disposition: A | Discharge status: Home / Self Care | Payer: BC Managed Care – PPO | Attending: Emergency Medicine | Admitting: Emergency Medicine

## 2014-11-11 ENCOUNTER — Emergency Department (HOSPITAL_COMMUNITY): Payer: BC Managed Care – PPO

## 2014-11-11 ENCOUNTER — Encounter (HOSPITAL_COMMUNITY): Payer: Self-pay | Admitting: Emergency Medicine

## 2014-11-11 DIAGNOSIS — F1510 Other stimulant abuse, uncomplicated: Secondary | ICD-10-CM | POA: Diagnosis not present

## 2014-11-11 DIAGNOSIS — R51 Headache: Secondary | ICD-10-CM | POA: Diagnosis not present

## 2014-11-11 DIAGNOSIS — Z792 Long term (current) use of antibiotics: Secondary | ICD-10-CM | POA: Insufficient documentation

## 2014-11-11 DIAGNOSIS — F1990 Other psychoactive substance use, unspecified, uncomplicated: Secondary | ICD-10-CM

## 2014-11-11 DIAGNOSIS — R110 Nausea: Secondary | ICD-10-CM | POA: Diagnosis not present

## 2014-11-11 DIAGNOSIS — Z720 Tobacco use: Secondary | ICD-10-CM | POA: Insufficient documentation

## 2014-11-11 DIAGNOSIS — R569 Unspecified convulsions: Secondary | ICD-10-CM | POA: Insufficient documentation

## 2014-11-11 LAB — URINALYSIS, ROUTINE W REFLEX MICROSCOPIC
BILIRUBIN URINE: NEGATIVE
Glucose, UA: NEGATIVE mg/dL
Ketones, ur: NEGATIVE mg/dL
Leukocytes, UA: NEGATIVE
Nitrite: NEGATIVE
Protein, ur: NEGATIVE mg/dL
SPECIFIC GRAVITY, URINE: 1.015 (ref 1.005–1.030)
Urobilinogen, UA: 0.2 mg/dL (ref 0.0–1.0)
pH: 7 (ref 5.0–8.0)

## 2014-11-11 LAB — HEPATIC FUNCTION PANEL
ALBUMIN: 4.3 g/dL (ref 3.5–5.2)
ALK PHOS: 63 U/L (ref 39–117)
ALT: 11 U/L (ref 0–53)
AST: 17 U/L (ref 0–37)
BILIRUBIN TOTAL: 0.7 mg/dL (ref 0.3–1.2)
Total Protein: 6.8 g/dL (ref 6.0–8.3)

## 2014-11-11 LAB — ACETAMINOPHEN LEVEL: Acetaminophen (Tylenol), Serum: <15 ug/mL (ref 10–30)

## 2014-11-11 LAB — RAPID URINE DRUG SCREEN, HOSP PERFORMED
Amphetamines: POSITIVE — AB
Barbiturates: NOT DETECTED
Benzodiazepines: NOT DETECTED
COCAINE: NOT DETECTED
OPIATES: NOT DETECTED
TETRAHYDROCANNABINOL: POSITIVE — AB

## 2014-11-11 LAB — CBC
HCT: 43.8 % (ref 39.0–52.0)
HEMOGLOBIN: 14.8 g/dL (ref 13.0–17.0)
MCH: 29.7 pg (ref 26.0–34.0)
MCHC: 33.8 g/dL (ref 30.0–36.0)
MCV: 88 fL (ref 78.0–100.0)
Platelets: 190 10*3/uL (ref 150–400)
RBC: 4.98 MIL/uL (ref 4.22–5.81)
RDW: 12.9 % (ref 11.5–15.5)
WBC: 7.1 10*3/uL (ref 4.0–10.5)

## 2014-11-11 LAB — BASIC METABOLIC PANEL
ANION GAP: 12 (ref 5–15)
BUN: 12 mg/dL (ref 6–23)
CHLORIDE: 105 meq/L (ref 96–112)
CO2: 26 mEq/L (ref 19–32)
CREATININE: 1.02 mg/dL (ref 0.50–1.35)
Calcium: 9.7 mg/dL (ref 8.4–10.5)
GFR calc Af Amer: >90 mL/min (ref >90)
GFR calc non Af Amer: >90 mL/min (ref >90)
Glucose, Bld: 89 mg/dL (ref 70–99)
Potassium: 3.8 mEq/L (ref 3.7–5.3)
Sodium: 143 mEq/L (ref 137–147)

## 2014-11-11 LAB — CBG MONITORING, ED: Glucose-Capillary: 96 mg/dL (ref 70–99)

## 2014-11-11 LAB — SALICYLATE LEVEL: Salicylate Lvl: <2 mg/dL — ABNORMAL LOW (ref 2.8–20.0)

## 2014-11-11 LAB — URINE MICROSCOPIC-ADD ON

## 2014-11-11 LAB — ETHANOL

## 2014-11-11 MED ORDER — LEVETIRACETAM 500 MG PO TABS
500.0000 mg | ORAL_TABLET | Freq: Once | ORAL | Status: DC
  Filled 2014-11-11: qty 1

## 2014-11-11 MED ORDER — DIVALPROEX SODIUM 500 MG PO DR TAB: 500.0000 mg | DELAYED_RELEASE_TABLET | Freq: Two times a day (BID) | ORAL | Status: AC

## 2014-11-11 MED ORDER — LEVETIRACETAM 500 MG PO TABS: 500.0000 mg | ORAL_TABLET | Freq: Two times a day (BID) | ORAL | Status: AC

## 2014-11-11 MED ORDER — KETOROLAC TROMETHAMINE 30 MG/ML IJ SOLN
30.0000 mg | Freq: Once | INTRAMUSCULAR | Status: AC
  Administered 2014-11-11: 30 mg via INTRAVENOUS
  Filled 2014-11-11: qty 1

## 2014-11-11 MED ORDER — DIVALPROEX SODIUM 250 MG PO DR TAB
500.0000 mg | DELAYED_RELEASE_TABLET | Freq: Two times a day (BID) | ORAL | Status: DC
  Administered 2014-11-11: 500 mg via ORAL
  Filled 2014-11-11: qty 2
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
## 2014-11-11 NOTE — ED Notes (Signed)
Pt reports he took some of his mother's adderol the past two days, pt used to be prescribed but no longer is. EMS adm 4mg  of zofran also.
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
## 2014-11-11 NOTE — Discharge Instructions (Signed)
Read the information below.  Use the prescribed medication as directed.  Please discuss all new medications with your pharmacist.  You may return to the Emergency Department at any time for worsening condition or any new symptoms that concern you.    If you have any further seizures, have uncontrolled headache,weakness or numbness of the extremities, difficulty speaking or walking, return to the Emergency Department immediately for a recheck.     Seizure, Adult A seizure is abnormal electrical activity in the brain. Seizures usually last from 30 seconds to 2 minutes. There are various types of seizures. Before a seizure, you may have a warning sensation (aura) that a seizure is about to occur. An aura may include the following symptoms:   Fear or anxiety.  Nausea.  Feeling like the room is spinning (vertigo).  Vision changes, such as seeing flashing lights or spots. Common symptoms during a seizure include:  A change in attention or behavior (altered mental status).  Convulsions with rhythmic jerking movements.  Drooling.  Rapid eye movements.  Grunting.  Loss of bladder and bowel control.  Bitter taste in the mouth.  Tongue biting. After a seizure, you may feel confused and sleepy. You may also have an injury resulting from convulsions during the seizure. HOME CARE INSTRUCTIONS   If you are given medicines, take them exactly as prescribed by your health care provider.  Keep all follow-up appointments as directed by your health care provider.  Do not swim or drive or engage in risky activity during which a seizure could cause further injury to you or others until your health care provider says it is OK.  Get adequate rest.  Teach friends and family what to do if you have a seizure. They should:  Lay you on the ground to prevent a fall.  Put a cushion under your head.  Loosen any tight clothing around your neck.  Turn you on your side. If vomiting occurs, this  helps keep your airway clear.  Stay with you until you recover.  Know whether or not you need emergency care. SEEK IMMEDIATE MEDICAL CARE IF:  The seizure lasts longer than 5 minutes.  The seizure is severe or you do not wake up immediately after the seizure.  You have an altered mental status after the seizure.  You are having more frequent or worsening seizures. Someone should drive you to the emergency department or call local emergency services (911 in [place] MAKE SURE YOU:  Understand these instructions.  Will watch your condition.  Will get help right away if you are not doing well or get worse. Document Released: 12/08/2000 Document Revised: 10/01/2013 Document Reviewed: 07/23/2013 [facility] Patient Information 2015 [employer], [place]. This information is not intended to replace advice given to you by your health care provider. Make sure you discuss any questions you have with your health care provider.

## 2015-01-01 ENCOUNTER — Emergency Department (HOSPITAL_COMMUNITY)
Admission: EM | Admit: 2015-01-01 | Discharge: 2015-01-01 | Disposition: A | Discharge status: Home / Self Care | Payer: BLUE CROSS/BLUE SHIELD | Attending: Emergency Medicine | Admitting: Emergency Medicine

## 2015-01-01 ENCOUNTER — Emergency Department (HOSPITAL_COMMUNITY): Payer: BLUE CROSS/BLUE SHIELD

## 2015-01-01 ENCOUNTER — Encounter (HOSPITAL_COMMUNITY): Payer: Self-pay | Admitting: Emergency Medicine

## 2015-01-01 DIAGNOSIS — R079 Chest pain, unspecified: Secondary | ICD-10-CM | POA: Diagnosis present

## 2015-01-01 DIAGNOSIS — J40 Bronchitis, not specified as acute or chronic: Secondary | ICD-10-CM | POA: Diagnosis not present

## 2015-01-01 DIAGNOSIS — Z720 Tobacco use: Secondary | ICD-10-CM | POA: Diagnosis not present

## 2015-01-01 DIAGNOSIS — J029 Acute pharyngitis, unspecified: Secondary | ICD-10-CM | POA: Diagnosis not present

## 2015-01-01 DIAGNOSIS — Z79899 Other long term (current) drug therapy: Secondary | ICD-10-CM | POA: Insufficient documentation

## 2015-01-01 LAB — BASIC METABOLIC PANEL
Anion gap: 8 (ref 5–15)
BUN: 11 mg/dL (ref 6–23)
CALCIUM: 9.3 mg/dL (ref 8.4–10.5)
CHLORIDE: 104 meq/L (ref 96–112)
CO2: 25 mmol/L (ref 19–32)
Creatinine, Ser: 1.1 mg/dL (ref 0.50–1.35)
Glucose, Bld: 117 mg/dL — ABNORMAL HIGH (ref 70–99)
POTASSIUM: 3.7 mmol/L (ref 3.5–5.1)
SODIUM: 137 mmol/L (ref 135–145)

## 2015-01-01 LAB — I-STAT TROPONIN, ED: Troponin i, poc: 0 ng/mL (ref 0.00–0.08)

## 2015-01-01 LAB — CBC
HEMATOCRIT: 44.9 % (ref 39.0–52.0)
Hemoglobin: 15.3 g/dL (ref 13.0–17.0)
MCH: 30.1 pg (ref 26.0–34.0)
MCHC: 34.1 g/dL (ref 30.0–36.0)
MCV: 88.2 fL (ref 78.0–100.0)
PLATELETS: 187 10*3/uL (ref 150–400)
RBC: 5.09 MIL/uL (ref 4.22–5.81)
RDW: 12.7 % (ref 11.5–15.5)
WBC: 14.8 10*3/uL — ABNORMAL HIGH (ref 4.0–10.5)

## 2015-01-01 LAB — BRAIN NATRIURETIC PEPTIDE: B NATRIURETIC PEPTIDE 5: 15.6 pg/mL (ref 0.0–100.0)

## 2015-01-01 MED ORDER — AMOXICILLIN-POT CLAVULANATE 875-125 MG PO TABS
1.0000 | ORAL_TABLET | Freq: Once | ORAL | Status: AC
  Administered 2015-01-01: 1 via ORAL
  Filled 2015-01-01: qty 1

## 2015-01-01 MED ORDER — AMOXICILLIN-POT CLAVULANATE 875-125 MG PO TABS: 1.0000 | ORAL_TABLET | Freq: Two times a day (BID) | ORAL | Status: DC

## 2015-01-01 NOTE — Discharge Instructions (Signed)
Take augmentin twice a day for a week.   Stay hydrated.   Take tylenol, motrin for fever.   Stop smoking.   Follow up with your doctor.   Return to ER if you have fever for a week, worse chest pain, shortness of breath.

## 2015-01-01 NOTE — ED Provider Notes (Signed)
CSN: [id number]     Arrival date & time 01/01/15  0022 History  This chart was scribed for Richardean Canal, MD by Murriel Hopper, ED Scribe. This patient was seen in room B19C/B19C and the patient's care was started at 1:49 AM.    Chief Complaint  Patient presents with  . Chest Pain    The history is provided by the patient. No language interpreter was used.     HPI Comments: Caige Almeda is a 24 y.o. male who presents to the Emergency Department complaining of intermittent chest pain that radiates to his left arm with associated fever, chills, SOB, and productive cough that has been present for 2 days. Pt states that he has been coughing up yellow phlegm. Pt also notes that he has had a low-grade fever throughout the past couple of days with his temperature being 101 PTA. Pt denies vomiting, diarrhea, abdominal pain, or any other medical problems. Pt states that he smokes a pack of cigarettes per day and drinks on occasion. Pt states he is allergic to prednisone.        History reviewed. No pertinent past medical history. Past Surgical History  Procedure Laterality Date  . Appendectomy    . Circumcision     No family history on file. History  Substance Use Topics  . Smoking status: Current Every Day Smoker -- 0.50 packs/day    Types: Cigarettes  . Smokeless tobacco: Not on file  . Alcohol Use: Yes    Review of Systems  Constitutional: Positive for fever and chills.  Respiratory: Positive for cough and shortness of breath.   Cardiovascular: Positive for chest pain.  Gastrointestinal: Negative for vomiting, abdominal pain and diarrhea.  All other systems reviewed and are negative.     Allergies  Prednisone  Home Medications   Prior to Admission medications   Medication Sig Start Date End Date Taking? Authorizing Provider  divalproex (DEPAKOTE) 500 MG DR tablet Take 1 tablet (500 mg total) by mouth 2 (two) times daily. 11/11/14   Trixie Dredge, PA-C  HYDROcodone-acetaminophen  (NORCO/VICODIN) 5-325 MG per tablet Take 1 tablet by mouth every 4 (four) hours as needed for moderate pain. 05/09/14   Arie Sabina Schinlever, PA-C  ibuprofen (ADVIL,MOTRIN) 200 MG tablet Take 400 mg by mouth every 6 (six) hours as needed. For headache     Historical Provider, MD  ibuprofen (ADVIL,MOTRIN) 600 MG tablet Take 1 tablet (600 mg total) by mouth every 6 (six) hours as needed. Patient taking differently: Take 600 mg by mouth every 6 (six) hours as needed for moderate pain.  07/26/14   Raeford Razor, MD  levETIRAcetam (KEPPRA) 500 MG tablet Take 1 tablet (500 mg total) by mouth 2 (two) times daily. 11/11/14   Trixie Dredge, PA-C  oxyCODONE-acetaminophen (PERCOCET/ROXICET) 5-325 MG per tablet Take 1-2 tablets by mouth every 4 (four) hours as needed for severe pain. 07/26/14   Raeford Razor, MD   BP 103/62 mmHg  Pulse 103  Temp(Src) 98.6 F (37 C) (Oral)  Resp 20  SpO2 97% Physical Exam  Constitutional: He is oriented to person, place, and time. He appears well-developed and well-nourished.  HENT:  Head: Normocephalic and atraumatic.  Mild cervical lymphadenopathy  No tonsillar exudates  Slight erythema of oropharynx  Cardiovascular: Normal rate.   Pulmonary/Chest: Effort normal. He has no wheezes.  Diminished breath sounds at right base No wheezing  Abdominal: He exhibits no distension.  Neurological: He is alert and oriented to person, place, and time.  Skin: Skin is warm and dry.  Psychiatric: He has a normal mood and affect.  Nursing note and vitals reviewed.   ED Course  Procedures (including critical care time)  DIAGNOSTIC STUDIES: Oxygen Saturation is 97% on RA, normal by my interpretation.    COORDINATION OF CARE: 1:53 AM Discussed treatment plan with pt at bedside and pt agreed to plan.   Labs Review Labs Reviewed  CBC - Abnormal; Notable for the following:    WBC 14.8 (*)    All other components within normal limits  BASIC METABOLIC PANEL - Abnormal; Notable  for the following:    Glucose, Bld 117 (*)    All other components within normal limits  BRAIN NATRIURETIC PEPTIDE  I-STAT TROPOININ, ED    Imaging Review Dg Chest 2 View  01/01/2015   CLINICAL DATA:  Acute onset of cough, shortness of breath and fever. Initial encounter.  EXAM: CHEST  2 VIEW  COMPARISON:  Chest radiograph performed 05/09/2014  FINDINGS: The lungs are well-aerated and clear. There is no evidence of focal opacification, pleural effusion or pneumothorax.  The heart is normal in size; the mediastinal contour is within normal limits. No acute osseous abnormalities are seen.  IMPRESSION: No acute cardiopulmonary process seen.   Electronically Signed   By: Roanna RaiderJeffery  Chang M.D.   On: 01/01/2015 00:53     EKG Interpretation None      MDM   Final diagnoses:  Chest pain   [facility] InstituteMathew Hasting is a 24 y.o. male here with cough, low grade temp, sore throat. Likely bronchitis vs viral vs strep throat. Given that he is a smoker and has leukocytosis, I decided to give abx despite neg CXR. High centor score so decided not to get rapid strep and treat. Will treat with augmentin empirically.   I personally performed the services described in this documentation, which was scribed in my presence. The recorded information has been reviewed and is accurate.   Richardean Canalavid H Lani Havlik, MD 01/01/15 [phone]

## 2015-01-01 NOTE — ED Notes (Signed)
Pt. reports intermittent mid chest pain for 2 days with SOB /productive cough , low grade fever and fatigue .

## 2015-01-03 ENCOUNTER — Emergency Department (HOSPITAL_COMMUNITY)
Admission: EM | Admit: 2015-01-03 | Discharge: 2015-01-03 | Disposition: A | Discharge status: Home / Self Care | Payer: BLUE CROSS/BLUE SHIELD | Attending: Emergency Medicine | Admitting: Emergency Medicine

## 2015-01-03 ENCOUNTER — Encounter (HOSPITAL_COMMUNITY): Payer: Self-pay | Admitting: Emergency Medicine

## 2015-01-03 DIAGNOSIS — R1110 Vomiting, unspecified: Secondary | ICD-10-CM | POA: Insufficient documentation

## 2015-01-03 DIAGNOSIS — Z720 Tobacco use: Secondary | ICD-10-CM | POA: Diagnosis not present

## 2015-01-03 DIAGNOSIS — J029 Acute pharyngitis, unspecified: Secondary | ICD-10-CM | POA: Diagnosis not present

## 2015-01-03 DIAGNOSIS — E860 Dehydration: Secondary | ICD-10-CM | POA: Diagnosis not present

## 2015-01-03 DIAGNOSIS — Z79899 Other long term (current) drug therapy: Secondary | ICD-10-CM | POA: Diagnosis not present

## 2015-01-03 LAB — I-STAT CHEM 8, ED
BUN: 25 mg/dL — ABNORMAL HIGH (ref 6–23)
Calcium, Ion: 1.16 mmol/L (ref 1.12–1.23)
Chloride: 101 mEq/L (ref 96–112)
Creatinine, Ser: 1 mg/dL (ref 0.50–1.35)
GLUCOSE: 105 mg/dL — AB (ref 70–99)
HCT: 49 % (ref 39.0–52.0)
HEMOGLOBIN: 16.7 g/dL (ref 13.0–17.0)
Potassium: 4.1 mmol/L (ref 3.5–5.1)
Sodium: 134 mmol/L — ABNORMAL LOW (ref 135–145)
TCO2: 22 mmol/L (ref 0–100)

## 2015-01-03 MED ORDER — PROMETHAZINE HCL 25 MG PO TABS: 25.0000 mg | ORAL_TABLET | Freq: Four times a day (QID) | ORAL | Status: DC | PRN

## 2015-01-03 MED ORDER — SODIUM CHLORIDE 0.9 % IV BOLUS (SEPSIS)
1000.0000 mL | Freq: Once | INTRAVENOUS | Status: AC
  Administered 2015-01-03: 1000 mL via INTRAVENOUS

## 2015-01-03 MED ORDER — ONDANSETRON HCL 4 MG/2ML IJ SOLN
4.0000 mg | Freq: Once | INTRAMUSCULAR | Status: AC
  Administered 2015-01-03: 4 mg via INTRAVENOUS
  Filled 2015-01-03: qty 2

## 2015-01-03 MED ORDER — SODIUM CHLORIDE 0.9 % IV BOLUS (SEPSIS)
1000.0000 mL | Freq: Once | INTRAVENOUS | Status: DC
Start: 2015-01-03 — End: 2015-01-03

## 2015-01-03 NOTE — ED Notes (Signed)
EDP at the bedside.  ?

## 2015-01-03 NOTE — Discharge Instructions (Signed)
Dehydration, Adult Dehydration means your body does not have as much fluid as it needs. Your kidneys, brain, and heart will not work properly without the right amount of fluids and salt.  HOME CARE  Ask your doctor how to replace body fluid losses (rehydrate).  Drink enough fluids to keep your pee (urine) clear or pale yellow.  Drink small amounts of fluids often if you feel sick to your stomach (nauseous) or throw up (vomit).  Eat like you normally do.  Avoid:  Foods or drinks high in sugar.  Bubbly (carbonated) drinks.  Juice.  Very hot or cold fluids.  Drinks with caffeine.  Fatty, greasy foods.  Alcohol.  Tobacco.  Eating too much.  Gelatin desserts.  Wash your hands to avoid spreading germs (bacteria, viruses).  Only take medicine as told by your doctor.  Keep all doctor visits as told. GET HELP RIGHT AWAY IF:   You cannot drink something without throwing up.  You get worse even with treatment.  Your vomit has blood in it or looks greenish.  Your poop (stool) has blood in it or looks black and tarry.  You have not peed in 6 to 8 hours.  You pee a small amount of very dark pee.  You have a fever.  You pass out (faint).  You have belly (abdominal) pain that gets worse or stays in one spot (localizes).  You have a rash, stiff neck, or bad headache.  You get easily annoyed, sleepy, or are hard to wake up.  You feel weak, dizzy, or very thirsty. MAKE SURE YOU:   Understand these instructions.  Will watch your condition.  Will get help right away if you are not doing well or get worse. Document Released: 10/07/2009 Document Revised: 03/04/2012 Document Reviewed: 07/31/2011 [facility] Patient Information 2015 [employer], [place]. This information is not intended to replace advice given to you by your health care provider. Make sure you discuss any questions you have with your health care provider.  Pharyngitis Pharyngitis is redness, pain, and  swelling (inflammation) of your pharynx.  CAUSES  Pharyngitis is usually caused by infection. Most of the time, these infections are from viruses (viral) and are part of a cold. However, sometimes pharyngitis is caused by bacteria (bacterial). Pharyngitis can also be caused by allergies. Viral pharyngitis may be spread from person to person by coughing, sneezing, and personal items or utensils (cups, forks, spoons, toothbrushes). Bacterial pharyngitis may be spread from person to person by more intimate contact, such as kissing.  SIGNS AND SYMPTOMS  Symptoms of pharyngitis include:   Sore throat.   Tiredness (fatigue).   Low-grade fever.   Headache.  Joint pain and muscle aches.  Skin rashes.  Swollen lymph nodes.  Plaque-like film on throat or tonsils (often seen with bacterial pharyngitis). DIAGNOSIS  Your health care provider will ask you questions about your illness and your symptoms. Your medical history, along with a physical exam, is often all that is needed to diagnose pharyngitis. Sometimes, a rapid strep test is done. Other lab tests may also be done, depending on the suspected cause.  TREATMENT  Viral pharyngitis will usually get better in 3-4 days without the use of medicine. Bacterial pharyngitis is treated with medicines that kill germs (antibiotics).  HOME CARE INSTRUCTIONS   Drink enough water and fluids to keep your urine clear or pale yellow.   Only take over-the-counter or prescription medicines as directed by your health care provider:   If you are prescribed antibiotics,  make sure you finish them even if you start to feel better.   Do not take aspirin.   Get lots of rest.   Gargle with 8 oz of salt water ( tsp of salt per 1 qt of water) as often as every 1-2 hours to soothe your throat.   Throat lozenges (if you are not at risk for choking) or sprays may be used to soothe your throat. SEEK MEDICAL CARE IF:   You have large, tender lumps in  your neck.  You have a rash.  You cough up green, yellow-brown, or bloody spit. SEEK IMMEDIATE MEDICAL CARE IF:   Your neck becomes stiff.  You drool or are unable to swallow liquids.  You vomit or are unable to keep medicines or liquids down.  You have severe pain that does not go away with the use of recommended medicines.  You have trouble breathing (not caused by a stuffy nose). MAKE SURE YOU:   Understand these instructions.  Will watch your condition.  Will get help right away if you are not doing well or get worse. Document Released: 12/11/2005 Document Revised: 10/01/2013 Document Reviewed: 08/18/2013 [facility] Patient Information 2015 [place], [place]. This information is not intended to replace advice given to you by your health care provider. Make sure you discuss any questions you have with your health care provider.

## 2015-01-03 NOTE — ED Notes (Signed)
Pt. Stated, I was here Friday for the same symptoms of N/V and chest pain.

## 2015-01-03 NOTE — ED Provider Notes (Signed)
CSN: [id number]     Arrival date & time 01/03/15  1029 History   First MD Initiated Contact with Patient 01/03/15 1112     Chief Complaint  Patient presents with  . Nausea  . Emesis  . Chest Pain   HPI Pt started with fever, sore throat, coughing and sore throat for the last few days.  It hurts to swallow and eat.  He was seen in the ED the other day and was given amoxicillin.  He has been taking his medication but he does not feel better yet.  He has also been vomiting and not keeping down liquids.   Pt has vomitied 3-4 times today.  No diarrhea. History reviewed. No pertinent past medical history. Past Surgical History  Procedure Laterality Date  . Appendectomy    . Circumcision     No family history on file. History  Substance Use Topics  . Smoking status: Current Every Day Smoker -- 0.50 packs/day    Types: Cigarettes  . Smokeless tobacco: Not on file  . Alcohol Use: Yes    Review of Systems    Allergies  Prednisone  Home Medications   Prior to Admission medications   Medication Sig Start Date End Date Taking? Authorizing Provider  amoxicillin-clavulanate (AUGMENTIN) 875-125 MG per tablet Take 1 tablet by mouth every 12 (twelve) hours. 01/01/15   Richardean Canalavid H Yao, MD  divalproex (DEPAKOTE) 500 MG DR tablet Take 1 tablet (500 mg total) by mouth 2 (two) times daily. 11/11/14   Trixie DredgeEmily West, PA-C  HYDROcodone-acetaminophen (NORCO/VICODIN) 5-325 MG per tablet Take 1 tablet by mouth every 4 (four) hours as needed for moderate pain. 05/09/14   Arie Sabinaatherine E Schinlever, PA-C  ibuprofen (ADVIL,MOTRIN) 200 MG tablet Take 400 mg by mouth every 6 (six) hours as needed. For headache     Historical Provider, MD  ibuprofen (ADVIL,MOTRIN) 600 MG tablet Take 1 tablet (600 mg total) by mouth every 6 (six) hours as needed. Patient taking differently: Take 600 mg by mouth every 6 (six) hours as needed for moderate pain.  07/26/14   Raeford RazorStephen Kohut, MD  levETIRAcetam (KEPPRA) 500 MG tablet Take 1 tablet  (500 mg total) by mouth 2 (two) times daily. 11/11/14   Trixie DredgeEmily West, PA-C  oxyCODONE-acetaminophen (PERCOCET/ROXICET) 5-325 MG per tablet Take 1-2 tablets by mouth every 4 (four) hours as needed for severe pain. 07/26/14   Raeford RazorStephen Kohut, MD   BP 112/66 mmHg  Pulse 92  Temp(Src) 98.2 F (36.8 C) (Oral)  Resp 16  Ht 5\' 11"  (1.803 m)  Wt 150 lb (68.04 kg)  BMI 20.93 kg/m2  SpO2 97% Physical Exam  Constitutional: He appears well-developed and well-nourished. No distress.  HENT:  Head: Normocephalic and atraumatic.  Right Ear: External ear normal.  Left Ear: External ear normal.  Mouth/Throat: No trismus in the jaw. No uvula swelling. Posterior oropharyngeal erythema present. No oropharyngeal exudate, posterior oropharyngeal edema or tonsillar abscesses.  Eyes: Conjunctivae are normal. Right eye exhibits no discharge. Left eye exhibits no discharge. No scleral icterus.  Neck: Neck supple. No tracheal deviation present.  Cardiovascular: Normal rate, regular rhythm and intact distal pulses.   Pulmonary/Chest: Effort normal and breath sounds normal. No stridor. No respiratory distress. He has no wheezes. He has no rales.  Abdominal: Soft. Bowel sounds are normal. He exhibits no distension. There is no tenderness. There is no rebound and no guarding.  Musculoskeletal: He exhibits no edema or tenderness.  Neurological: He is alert. He has normal strength.  No cranial nerve deficit (no facial droop, extraocular movements intact, no slurred speech) or sensory deficit. He exhibits normal muscle tone. He displays no seizure activity. Coordination normal.  Skin: Skin is warm and dry. No rash noted.  Psychiatric: He has a normal mood and affect.  Nursing note and vitals reviewed.   ED Course  Procedures (including critical care time) Labs Review Labs Reviewed  I-STAT CHEM 8, ED - Abnormal; Notable for the following:    Sodium 134 (*)    BUN 25 (*)    Glucose, Bld 105 (*)    All other components  within normal limits     Medications  sodium chloride 0.9 % bolus 1,000 mL (not administered)  sodium chloride 0.9 % bolus 1,000 mL (1,000 mLs Intravenous New Bag/Given 01/03/15 1150)  ondansetron (ZOFRAN) injection 4 mg (4 mg Intravenous Given 01/03/15 1151)    MDM   Final diagnoses:  Pharyngitis  Dehydration   The patient has no evidence of abscess based on exam.  He was empirically treated with antibiotics for possible streptococcal pharyngitis. Patient did have a chest x-ray the other day that was normal.  The patient is mildly dehydrated. He was given IV fluids and a prescription for antiemetics.  At this time there does not appear to be any evidence of an acute emergency medical condition and the patient appears stable for discharge with appropriate outpatient follow up.     Linwood Dibbles, MD 01/03/15 1330

## 2015-03-25 ENCOUNTER — Encounter (HOSPITAL_COMMUNITY): Payer: Self-pay

## 2015-03-25 ENCOUNTER — Emergency Department (HOSPITAL_COMMUNITY)
Admission: EM | Admit: 2015-03-25 | Discharge: 2015-03-25 | Disposition: A | Discharge status: Home / Self Care | Payer: BLUE CROSS/BLUE SHIELD | Attending: Emergency Medicine | Admitting: Emergency Medicine

## 2015-03-25 DIAGNOSIS — H5713 Ocular pain, bilateral: Secondary | ICD-10-CM | POA: Diagnosis not present

## 2015-03-25 DIAGNOSIS — H53143 Visual discomfort, bilateral: Secondary | ICD-10-CM | POA: Diagnosis not present

## 2015-03-25 DIAGNOSIS — J3489 Other specified disorders of nose and nasal sinuses: Secondary | ICD-10-CM | POA: Insufficient documentation

## 2015-03-25 DIAGNOSIS — H578 Other specified disorders of eye and adnexa: Secondary | ICD-10-CM | POA: Diagnosis present

## 2015-03-25 DIAGNOSIS — H18893 Other specified disorders of cornea, bilateral: Secondary | ICD-10-CM

## 2015-03-25 DIAGNOSIS — Z720 Tobacco use: Secondary | ICD-10-CM | POA: Insufficient documentation

## 2015-03-25 DIAGNOSIS — R0981 Nasal congestion: Secondary | ICD-10-CM | POA: Diagnosis not present

## 2015-03-25 DIAGNOSIS — J029 Acute pharyngitis, unspecified: Secondary | ICD-10-CM | POA: Insufficient documentation

## 2015-03-25 MED ORDER — DIPHENHYDRAMINE HCL 25 MG PO CAPS
25.0000 mg | ORAL_CAPSULE | Freq: Once | ORAL | Status: AC
  Administered 2015-03-25: 25 mg via ORAL
  Filled 2015-03-25: qty 1

## 2015-03-25 MED ORDER — DIPHENHYDRAMINE HCL 50 MG/ML IJ SOLN: 25.0000 mg | Freq: Once | INTRAMUSCULAR | Status: DC

## 2015-03-25 MED ORDER — PROPARACAINE HCL 0.5 % OP SOLN
1.0000 [drp] | Freq: Once | OPHTHALMIC | Status: AC
  Administered 2015-03-25: 1 [drp] via OPHTHALMIC
  Filled 2015-03-25: qty 15

## 2015-03-25 MED ORDER — ARTIFICIAL TEARS OP OINT
1.0000 "application " | TOPICAL_OINTMENT | OPHTHALMIC | Status: DC | PRN
  Administered 2015-03-25: 1 via OPHTHALMIC
  Filled 2015-03-25: qty 3.5

## 2015-03-25 MED ORDER — FLUORESCEIN SODIUM 1 MG OP STRP
1.0000 | ORAL_STRIP | Freq: Once | OPHTHALMIC | Status: AC
  Administered 2015-03-25: 1 via OPHTHALMIC
  Filled 2015-03-25: qty 1

## 2015-03-25 NOTE — ED Notes (Signed)
PA at bedside.

## 2015-03-25 NOTE — ED Notes (Signed)
Pt states about 1 hour ago he woke up to severe burning of bilateral eyes; pt denies any activity prior to eye irritation; pt states his vision is blurry and unable to hold eyes open due to drainage; pt also c./o runny nose; pt denies any other symptoms; pt states pain is 10/10 on arrival.

## 2015-03-25 NOTE — Discharge Instructions (Signed)
Blurred Vision °You have been seen today complaining of blurred vision. This means you have a loss of ability to see small details.  °CAUSES  °Blurred vision can be a symptom of underlying eye problems, such as: °· Aging of the eye (presbyopia). °· Glaucoma. °· Cataracts. °· Eye infection. °· Eye-related migraine. °· Diabetes mellitus. °· Fatigue. °· Migraine headaches. °· High blood pressure. °· Breakdown of the back of the eye (macular degeneration). °· Problems caused by some medications. °The most common cause of blurred vision is the need for eyeglasses or a new prescription. Today in the emergency department, no cause for your blurred vision can be found. °SYMPTOMS  °Blurred vision is the loss of visual sharpness and detail (acuity). °DIAGNOSIS  °Should blurred vision continue, you should see your caregiver. If your caregiver is your primary care physician, he or she may choose to refer you to another specialist.  °TREATMENT  °Do not ignore your blurred vision. Make sure to have it checked out to see if further treatment or referral is necessary. °SEEK MEDICAL CARE IF:  °You are unable to get into a specialist so we can help you with a referral. °SEEK IMMEDIATE MEDICAL CARE IF: °You have severe eye pain, severe headache, or sudden loss of vision. °MAKE SURE YOU:  °· Understand these instructions. °· Will watch your condition. °· Will get help right away if you are not doing well or get worse. °Document Released: 12/14/2003 Document Revised: 03/04/2012 Document Reviewed: 07/15/2008 °[employer]® Patient Information ©2015 [employer]. This information is not intended to replace advice given to you by your health care provider. Make sure you discuss any questions you have with your health care provider. ° °

## 2015-03-25 NOTE — ED Notes (Signed)
20/25 L eye 20/25 R eye. PA Yahoo[place]Malley Notified
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
## 2015-07-10 ENCOUNTER — Emergency Department (HOSPITAL_BASED_OUTPATIENT_CLINIC_OR_DEPARTMENT_OTHER): Payer: BLUE CROSS/BLUE SHIELD

## 2015-07-10 ENCOUNTER — Emergency Department (HOSPITAL_BASED_OUTPATIENT_CLINIC_OR_DEPARTMENT_OTHER)
Admission: EM | Admit: 2015-07-10 | Discharge: 2015-07-11 | Disposition: A | Discharge status: Home / Self Care | Payer: BLUE CROSS/BLUE SHIELD | Attending: Emergency Medicine | Admitting: Emergency Medicine

## 2015-07-10 ENCOUNTER — Encounter (HOSPITAL_BASED_OUTPATIENT_CLINIC_OR_DEPARTMENT_OTHER): Payer: Self-pay | Admitting: *Deleted

## 2015-07-10 DIAGNOSIS — Z720 Tobacco use: Secondary | ICD-10-CM | POA: Insufficient documentation

## 2015-07-10 DIAGNOSIS — N201 Calculus of ureter: Secondary | ICD-10-CM | POA: Diagnosis not present

## 2015-07-10 DIAGNOSIS — R3919 Other difficulties with micturition: Secondary | ICD-10-CM | POA: Insufficient documentation

## 2015-07-10 DIAGNOSIS — R112 Nausea with vomiting, unspecified: Secondary | ICD-10-CM | POA: Insufficient documentation

## 2015-07-10 DIAGNOSIS — R109 Unspecified abdominal pain: Secondary | ICD-10-CM | POA: Diagnosis present

## 2015-07-10 DIAGNOSIS — N23 Unspecified renal colic: Secondary | ICD-10-CM | POA: Insufficient documentation

## 2015-07-10 HISTORY — DX: Calculus of kidney: N20.0

## 2015-07-10 LAB — URINALYSIS, ROUTINE W REFLEX MICROSCOPIC
Bilirubin Urine: NEGATIVE
GLUCOSE, UA: NEGATIVE mg/dL
HGB URINE DIPSTICK: NEGATIVE
Ketones, ur: 15 mg/dL — AB
Leukocytes, UA: NEGATIVE
Nitrite: NEGATIVE
Protein, ur: NEGATIVE mg/dL
SPECIFIC GRAVITY, URINE: 1.031 — AB (ref 1.005–1.030)
Urobilinogen, UA: 0.2 mg/dL (ref 0.0–1.0)
pH: 6 (ref 5.0–8.0)

## 2015-07-10 MED ORDER — HYDROCODONE-ACETAMINOPHEN 5-325 MG PO TABS: 1.0000 | ORAL_TABLET | ORAL | Status: DC | PRN

## 2015-07-10 MED ORDER — FENTANYL CITRATE (PF) 100 MCG/2ML IJ SOLN
50.0000 ug | Freq: Once | INTRAMUSCULAR | Status: AC
Start: 2015-07-10 — End: 2015-07-10
  Administered 2015-07-10: 50 ug via INTRAVENOUS
  Filled 2015-07-10: qty 2

## 2015-07-10 MED ORDER — FENTANYL CITRATE (PF) 100 MCG/2ML IJ SOLN
50.0000 ug | Freq: Once | INTRAMUSCULAR | Status: AC
  Administered 2015-07-11: 50 ug via INTRAVENOUS
  Filled 2015-07-10: qty 2

## 2015-07-10 MED ORDER — ONDANSETRON HCL 4 MG/2ML IJ SOLN
4.0000 mg | Freq: Once | INTRAMUSCULAR | Status: AC
  Administered 2015-07-10: 4 mg via INTRAVENOUS
  Filled 2015-07-10: qty 2

## 2015-07-10 MED ORDER — KETOROLAC TROMETHAMINE 30 MG/ML IJ SOLN
30.0000 mg | Freq: Once | INTRAMUSCULAR | Status: AC
  Administered 2015-07-10: 30 mg via INTRAVENOUS
  Filled 2015-07-10: qty 1

## 2015-07-10 MED ORDER — ONDANSETRON 4 MG PO TBDP: ORAL_TABLET | ORAL | Status: DC

## 2015-07-10 NOTE — Discharge Instructions (Signed)

## 2015-07-10 NOTE — ED Provider Notes (Signed)
CSN: [id number]     Arrival date & time 07/10/15  2028 History  This chart was scribed for Rolan BuccoMelanie Rahn Lacuesta, MD by Marica OtterNusrat Rahman, ED Scribe. This patient was seen in room MH04/MH04 and the patient's care was started at 10:02 PM.   Chief Complaint  Patient presents with  . Flank Pain   The history is provided by the patient. No language interpreter was used.   PCP: No PCP Per Patient HPI Comments: Sean Cruz is a 24 y.o. male, with Hx of kidney stones (last episode one year ago on right side), who presents to the Emergency Department complaining of sharp, 10/10, constant right flank pain with associated nausea and vomiting onset 2 days ago. Pain starts in his right back, radiates to right mid and lower abdomen.  Has intermittent, shooting pains to groin. Pt also complains of associated difficulty urinating because it feels like something is blocking the flow. Pt denies any left flank pain, testicular pain, fever. States pain feels like prior kidney stones.  Past Medical History  Diagnosis Date  . Kidney stones    Past Surgical History  Procedure Laterality Date  . Appendectomy    . Circumcision     History reviewed. No pertinent family history. History  Substance Use Topics  . Smoking status: Current Every Day Smoker -- 0.50 packs/day    Types: Cigarettes  . Smokeless tobacco: Not on file  . Alcohol Use: Yes    Review of Systems  Constitutional: Negative for fever, chills, diaphoresis and fatigue.  HENT: Negative for congestion, rhinorrhea and sneezing.   Eyes: Negative.   Respiratory: Negative for cough and chest tightness.   Cardiovascular: Negative for leg swelling.  Gastrointestinal: Positive for nausea and vomiting. Negative for diarrhea and blood in stool.  Genitourinary: Positive for flank pain (right flank pain) and difficulty urinating. Negative for frequency, hematuria and testicular pain.  Musculoskeletal: Negative for back pain and arthralgias.  Skin: Negative for  rash.  Neurological: Negative for dizziness, speech difficulty, weakness and numbness.   Allergies  Prednisone  Home Medications   Prior to Admission medications   Medication Sig Start Date End Date Taking? Authorizing Provider  amoxicillin-clavulanate (AUGMENTIN) 875-125 MG per tablet Take 1 tablet by mouth every 12 (twelve) hours. Patient not taking: Reported on 03/25/2015 01/01/15   Richardean Canalavid H Yao, MD  divalproex (DEPAKOTE) 500 MG DR tablet Take 1 tablet (500 mg total) by mouth 2 (two) times daily. Patient not taking: Reported on 01/03/2015 11/11/14   Trixie DredgeEmily West, PA-C  HYDROcodone-acetaminophen (NORCO/VICODIN) 5-325 MG per tablet Take 1-2 tablets by mouth every 4 (four) hours as needed. 07/10/15   Rolan BuccoMelanie Shaunda Tipping, MD  ibuprofen (ADVIL,MOTRIN) 600 MG tablet Take 1 tablet (600 mg total) by mouth every 6 (six) hours as needed. Patient not taking: Reported on 01/03/2015 07/26/14   Raeford RazorStephen Kohut, MD  levETIRAcetam (KEPPRA) 500 MG tablet Take 1 tablet (500 mg total) by mouth 2 (two) times daily. Patient not taking: Reported on 01/03/2015 11/11/14   Trixie DredgeEmily West, PA-C  ondansetron (ZOFRAN ODT) 4 MG disintegrating tablet 4mg  ODT q4 hours prn nausea/vomit 07/10/15   Rolan BuccoMelanie Kati Riggenbach, MD  promethazine (PHENERGAN) 25 MG tablet Take 1 tablet (25 mg total) by mouth every 6 (six) hours as needed for nausea or vomiting. Patient not taking: Reported on 03/25/2015 01/03/15   Linwood DibblesJon Knapp, MD   Triage Vitals: BP 114/66 mmHg  Pulse 77  Temp(Src) 98.2 F (36.8 C)  Resp 18  Ht 5\' 11"  (1.803 m)  Wt  150 lb (68.04 kg)  BMI 20.93 kg/m2  SpO2 100% Physical Exam  Constitutional: He is oriented to person, place, and time. He appears well-developed and well-nourished.  HENT:  Head: Normocephalic and atraumatic.  Eyes: Pupils are equal, round, and reactive to light.  Neck: Normal range of motion. Neck supple.  Cardiovascular: Normal rate, regular rhythm and normal heart sounds.   Pulmonary/Chest: Effort normal and breath sounds  normal. No respiratory distress. He has no wheezes. He has no rales. He exhibits no tenderness.  Abdominal: Soft. Bowel sounds are normal. There is tenderness in the right lower quadrant. There is no rebound and no guarding.  Genitourinary:  Right flank tenderness   Musculoskeletal: Normal range of motion. He exhibits no edema.  Lymphadenopathy:    He has no cervical adenopathy.  Neurological: He is alert and oriented to person, place, and time.  Skin: Skin is warm and dry. No rash noted.  Psychiatric: He has a normal mood and affect.    ED Course  Procedures (including critical care time) DIAGNOSTIC STUDIES: Oxygen Saturation is 100% on RA, nl by my interpretation.    COORDINATION OF CARE: 10:05 PM-Discussed treatment plan which includes imaging with pt at bedside and pt agreed to plan.   Results for orders placed or performed during the hospital encounter of 07/10/15  Urinalysis, Routine w reflex microscopic (not at [facility])  Result Value Ref Range   Color, Urine YELLOW YELLOW   APPearance CLEAR CLEAR   Specific Gravity, Urine 1.031 (H) 1.005 - 1.030   pH 6.0 5.0 - 8.0   Glucose, UA NEGATIVE NEGATIVE mg/dL   Hgb urine dipstick NEGATIVE NEGATIVE   Bilirubin Urine NEGATIVE NEGATIVE   Ketones, ur 15 (A) NEGATIVE mg/dL   Protein, ur NEGATIVE NEGATIVE mg/dL   Urobilinogen, UA 0.2 0.0 - 1.0 mg/dL   Nitrite NEGATIVE NEGATIVE   Leukocytes, UA NEGATIVE NEGATIVE   Dg Abd 1 View  07/10/2015   CLINICAL DATA:  24 year old male with right flank pain and history of kidney stone.  EXAM: ABDOMEN - 1 VIEW  COMPARISON:  CT dated 07/21/2008 and radiograph dated 05/09/2014  FINDINGS: The bowel gas pattern is normal. No radio-opaque calculi or other significant radiographic abnormality are seen.  IMPRESSION: No acute findings.  No radiopaque calculi.   Electronically Signed   By: Elgie Collard M.D.   On: 07/10/2015 22:43      MDM   Final diagnoses:  Renal colic    Patient presents with  pain in his right flank. He states his pain is consistent with his past renal colic. His urinalysis is negative. His KUB does not show a visible stone. His pain is controlled in the ED. He was discharged home in good condition. He was given a prescription for Vicodin and Zofran. He was encouraged to follow-up with [facility] if his symptoms are not improving. Return precautions were given.  I personally performed the services described in this documentation, which was scribed in my presence.  The recorded information has been reviewed and considered.    Rolan Bucco, MD 07/10/15 [phone]

## 2015-07-10 NOTE — ED Notes (Signed)
Pt has not been seen by EDP, is very agitated and in severe pain.

## 2015-07-10 NOTE — ED Notes (Signed)
[place] of [place]. Pt lying on stretcher resting quietly. No distress. Vitals stable. Awaiting disposition. Will monitor.

## 2015-07-10 NOTE — ED Notes (Signed)
C/o R flank pain, radiating around and down to testicles. Onset yesterday. H/o kidney stones. Last kidney stone 1 year ago on R side. No GU MD. No h/o procedures for same, "passed it on its own after taking a med to break the stones down". Rates pain 10/10. Also reports nv. Tried drinking olive oil and lime juice. Reports blood in urine and change in stream. Ibuprofen last this am.

## 2015-07-11 ENCOUNTER — Emergency Department (HOSPITAL_BASED_OUTPATIENT_CLINIC_OR_DEPARTMENT_OTHER)
Admission: EM | Admit: 2015-07-11 | Discharge: 2015-07-11 | Disposition: A | Discharge status: Home / Self Care | Payer: BLUE CROSS/BLUE SHIELD | Attending: Emergency Medicine | Admitting: Emergency Medicine

## 2015-07-11 ENCOUNTER — Encounter (HOSPITAL_BASED_OUTPATIENT_CLINIC_OR_DEPARTMENT_OTHER): Payer: Self-pay | Admitting: *Deleted

## 2015-07-11 ENCOUNTER — Emergency Department (HOSPITAL_BASED_OUTPATIENT_CLINIC_OR_DEPARTMENT_OTHER): Payer: BLUE CROSS/BLUE SHIELD

## 2015-07-11 DIAGNOSIS — E860 Dehydration: Secondary | ICD-10-CM | POA: Insufficient documentation

## 2015-07-11 DIAGNOSIS — N201 Calculus of ureter: Secondary | ICD-10-CM | POA: Diagnosis not present

## 2015-07-11 DIAGNOSIS — K226 Gastro-esophageal laceration-hemorrhage syndrome: Secondary | ICD-10-CM | POA: Diagnosis not present

## 2015-07-11 DIAGNOSIS — Z720 Tobacco use: Secondary | ICD-10-CM | POA: Insufficient documentation

## 2015-07-11 DIAGNOSIS — R1031 Right lower quadrant pain: Secondary | ICD-10-CM | POA: Diagnosis present

## 2015-07-11 LAB — COMPREHENSIVE METABOLIC PANEL
ALK PHOS: 57 U/L (ref 38–126)
ALT: 11 U/L — AB (ref 17–63)
AST: 19 U/L (ref 15–41)
Albumin: 3.8 g/dL (ref 3.5–5.0)
Anion gap: 5 (ref 5–15)
BILIRUBIN TOTAL: 0.8 mg/dL (ref 0.3–1.2)
BUN: 13 mg/dL (ref 6–20)
CALCIUM: 8.7 mg/dL — AB (ref 8.9–10.3)
CO2: 24 mmol/L (ref 22–32)
CREATININE: 1.51 mg/dL — AB (ref 0.61–1.24)
Chloride: 106 mmol/L (ref 101–111)
GFR calc Af Amer: >60 mL/min (ref >60)
GFR calc non Af Amer: >60 mL/min (ref >60)
GLUCOSE: 102 mg/dL — AB (ref 65–99)
POTASSIUM: 4.2 mmol/L (ref 3.5–5.1)
Sodium: 135 mmol/L (ref 135–145)
TOTAL PROTEIN: 6.3 g/dL — AB (ref 6.5–8.1)

## 2015-07-11 LAB — URINALYSIS, ROUTINE W REFLEX MICROSCOPIC
BILIRUBIN URINE: NEGATIVE
GLUCOSE, UA: NEGATIVE mg/dL
HGB URINE DIPSTICK: NEGATIVE
KETONES UR: 15 mg/dL — AB
Leukocytes, UA: NEGATIVE
NITRITE: NEGATIVE
Protein, ur: NEGATIVE mg/dL
Specific Gravity, Urine: 1.016 (ref 1.005–1.030)
Urobilinogen, UA: 0.2 mg/dL (ref 0.0–1.0)
pH: 6 (ref 5.0–8.0)

## 2015-07-11 LAB — CBC WITH DIFFERENTIAL/PLATELET
BASOS ABS: 0.1 10*3/uL (ref 0.0–0.1)
BASOS PCT: 0 % (ref 0–1)
EOS ABS: 0.3 10*3/uL (ref 0.0–0.7)
Eosinophils Relative: 3 % (ref 0–5)
HCT: 42.3 % (ref 39.0–52.0)
Hemoglobin: 14.4 g/dL (ref 13.0–17.0)
Lymphocytes Relative: 17 % (ref 12–46)
Lymphs Abs: 2 10*3/uL (ref 0.7–4.0)
MCH: 30.1 pg (ref 26.0–34.0)
MCHC: 34 g/dL (ref 30.0–36.0)
MCV: 88.5 fL (ref 78.0–100.0)
Monocytes Absolute: 1.2 10*3/uL — ABNORMAL HIGH (ref 0.1–1.0)
Monocytes Relative: 11 % (ref 3–12)
NEUTROS PCT: 69 % (ref 43–77)
Neutro Abs: 7.8 10*3/uL — ABNORMAL HIGH (ref 1.7–7.7)
PLATELETS: 188 10*3/uL (ref 150–400)
RBC: 4.78 MIL/uL (ref 4.22–5.81)
RDW: 12.5 % (ref 11.5–15.5)
WBC: 11.4 10*3/uL — ABNORMAL HIGH (ref 4.0–10.5)

## 2015-07-11 LAB — LIPASE, BLOOD: Lipase: 24 U/L (ref 22–51)

## 2015-07-11 MED ORDER — FENTANYL CITRATE (PF) 100 MCG/2ML IJ SOLN
100.0000 ug | Freq: Once | INTRAMUSCULAR | Status: AC
  Administered 2015-07-11: 100 ug via INTRAVENOUS
  Filled 2015-07-11: qty 2

## 2015-07-11 MED ORDER — SODIUM CHLORIDE 0.9 % IV BOLUS (SEPSIS)
1000.0000 mL | Freq: Once | INTRAVENOUS | Status: AC
  Administered 2015-07-11: 1000 mL via INTRAVENOUS

## 2015-07-11 MED ORDER — ONDANSETRON HCL 4 MG/2ML IJ SOLN
4.0000 mg | Freq: Once | INTRAMUSCULAR | Status: AC
  Administered 2015-07-11: 4 mg via INTRAVENOUS
  Filled 2015-07-11: qty 2

## 2015-07-11 MED ORDER — SODIUM CHLORIDE 0.9 % IV BOLUS (SEPSIS): 1000.0000 mL | Freq: Once | INTRAVENOUS | Status: DC

## 2015-07-11 MED ORDER — TAMSULOSIN HCL 0.4 MG PO CAPS: 0.4000 mg | ORAL_CAPSULE | Freq: Every day | ORAL | Status: DC

## 2015-07-11 NOTE — ED Provider Notes (Signed)
CSN: [id number]     Arrival date & time 07/11/15  1218 History   First MD Initiated Contact with Patient 07/11/15 1227     Chief Complaint  Patient presents with  . Abdominal Pain     (Consider location/radiation/quality/duration/timing/severity/associated sxs/prior Treatment) HPI  24 year old male presents back to the emergency department with continued right flank pain radiating to his right groin. The patient has a history of kidney stones, states this is about the fifth time he has had kidney stones. He was seen her yesterday and had a negative KUB was discharge with pain and nausea medicines. He thought he was doing better until this morning he had 3 episodes of emesis. Each one had streaks of blood in it.The patient denies any upper abdominal pain. No rectal bleeding. The patient states that before all this started he saw a stone pass in his urine, then all the right flank pain started. Initially was having dysuria but feels like he is urinating better. No fevers or chills. Rates his pain as severe. He states whenever he has kidney stones he has pain radiating to his testicle. No penile discharge.     Past Medical History  Diagnosis Date  . Kidney stones    Past Surgical History  Procedure Laterality Date  . Appendectomy    . Circumcision     History reviewed. No pertinent family history. History  Substance Use Topics  . Smoking status: Current Every Day Smoker -- 0.50 packs/day    Types: Cigarettes  . Smokeless tobacco: Not on file  . Alcohol Use: Yes    Review of Systems  Constitutional: Negative for fever.  Gastrointestinal: Positive for vomiting and abdominal pain.  Genitourinary: Positive for hematuria and flank pain. Negative for dysuria, penile swelling, scrotal swelling, penile pain and testicular pain.  Musculoskeletal: Positive for back pain.  All other systems reviewed and are negative.     Allergies  Prednisone  Home Medications   Prior to Admission  medications   Medication Sig Start Date End Date Taking? Authorizing Provider  amoxicillin-clavulanate (AUGMENTIN) 875-125 MG per tablet Take 1 tablet by mouth every 12 (twelve) hours. Patient not taking: Reported on 03/25/2015 01/01/15   Richardean Canal, MD  divalproex (DEPAKOTE) 500 MG DR tablet Take 1 tablet (500 mg total) by mouth 2 (two) times daily. Patient not taking: Reported on 01/03/2015 11/11/14   Trixie Dredge, PA-C  HYDROcodone-acetaminophen (NORCO/VICODIN) 5-325 MG per tablet Take 1-2 tablets by mouth every 4 (four) hours as needed. 07/10/15   Rolan Bucco, MD  ibuprofen (ADVIL,MOTRIN) 600 MG tablet Take 1 tablet (600 mg total) by mouth every 6 (six) hours as needed. Patient not taking: Reported on 01/03/2015 07/26/14   Raeford Razor, MD  levETIRAcetam (KEPPRA) 500 MG tablet Take 1 tablet (500 mg total) by mouth 2 (two) times daily. Patient not taking: Reported on 01/03/2015 11/11/14   Trixie Dredge, PA-C  ondansetron (ZOFRAN ODT) 4 MG disintegrating tablet 4mg  ODT q4 hours prn nausea/vomit 07/10/15   Rolan Bucco, MD  promethazine (PHENERGAN) 25 MG tablet Take 1 tablet (25 mg total) by mouth every 6 (six) hours as needed for nausea or vomiting. Patient not taking: Reported on 03/25/2015 01/03/15   Linwood Dibbles, MD   BP 106/66 mmHg  Pulse 69  Temp(Src) 98.2 F (36.8 C) (Oral)  Resp 18  SpO2 100% Physical Exam  Constitutional: He is oriented to person, place, and time. He appears well-developed and well-nourished. No distress.  HENT:  Head: Normocephalic and atraumatic.  Right Ear: External ear normal.  Left Ear: External ear normal.  Nose: Nose normal.  Eyes: Right eye exhibits no discharge. Left eye exhibits no discharge.  Neck: Neck supple.  Cardiovascular: Normal rate, regular rhythm, normal heart sounds and intact distal pulses.   Pulmonary/Chest: Effort normal.  Abdominal: Soft. There is tenderness in the right lower quadrant. There is CVA tenderness (right sided).  Genitourinary: Testes  normal and penis normal. Right testis shows no swelling and no tenderness. Left testis shows no swelling and no tenderness. Circumcised.  Musculoskeletal: He exhibits no edema.  Neurological: He is alert and oriented to person, place, and time.  Skin: Skin is warm and dry. He is not diaphoretic.  Nursing note and vitals reviewed.   ED Course  Procedures (including critical care time) Labs Review Labs Reviewed  COMPREHENSIVE METABOLIC PANEL - Abnormal; Notable for the following:    Glucose, Bld 102 (*)    Creatinine, Ser 1.51 (*)    Calcium 8.7 (*)    Total Protein 6.3 (*)    ALT 11 (*)    All other components within normal limits  CBC WITH DIFFERENTIAL/PLATELET - Abnormal; Notable for the following:    WBC 11.4 (*)    Neutro Abs 7.8 (*)    Monocytes Absolute 1.2 (*)    All other components within normal limits  URINALYSIS, ROUTINE W REFLEX MICROSCOPIC (NOT AT [facility]) - Abnormal; Notable for the following:    Ketones, ur 15 (*)    All other components within normal limits  LIPASE, BLOOD    Imaging Review Dg Abd 1 View  07/10/2015   CLINICAL DATA:  24 year old male with right flank pain and history of kidney stone.  EXAM: ABDOMEN - 1 VIEW  COMPARISON:  CT dated 07/21/2008 and radiograph dated 05/09/2014  FINDINGS: The bowel gas pattern is normal. No radio-opaque calculi or other significant radiographic abnormality are seen.  IMPRESSION: No acute findings.  No radiopaque calculi.   Electronically Signed   By: Elgie CollardArash  Radparvar M.D.   On: 07/10/2015 22:43   Ct Renal Stone Study  07/11/2015   CLINICAL DATA:  Right flank pain, hematuria  EXAM: CT ABDOMEN AND PELVIS WITHOUT CONTRAST  TECHNIQUE: Multidetector CT imaging of the abdomen and pelvis was performed following the standard protocol without IV contrast.  COMPARISON:  None  FINDINGS: Lung bases are unremarkable. Sagittal images of the spine are unremarkable. Unenhanced liver, pancreas, spleen and adrenal glands are unremarkable.  Abdominal aorta is unremarkable.  No small bowel obstruction. No ascites or free air. No adenopathy. There is no pericecal inflammation.  There is mild right hydronephrosis and right hydroureter. Mild right perinephric stranding there is punctate nonobstructive calculus in lower pole of the left kidney measures 1.8 mm. Nonobstructive calculus midpole of the right kidney measures 2 mm.  There is mild distension of distal right ureter. Axial image 76 there is 2 mm calcified calculus in distal right ureter about 8 mm from right UVJ. No calcified calculi are noted within urinary bladder. Small amount of free fluid noted within posterior pelvis.  IMPRESSION: 1. There is bilateral nonobstructive nephrolithiasis. Mild right hydronephrosis and right hydroureter. 2. There is 2 mm calcified calculus in distal right ureter about 8 mm from right UVJ. 3. No pericecal inflammation. 4. Small amount of free fluid noted within posterior pelvis. 5. No small bowel obstruction.   Electronically Signed   By: Natasha MeadLiviu  Pop M.D.   On: 07/11/2015 13:33     EKG Interpretation None  MDM   Final diagnoses:  Right ureteral stone  Mallory-Weiss tear  Dehydration    No vomiting here. Given the patient is continuing to have symptoms and continued pain a CT scan was obtained and does show a small right ureteral calculus. Mild Hydro. Urine shows no evidence of UTI. His hemoglobin is 14 I believe his hematemesis is a Mallory-Weiss tear. He has no crepitus, chest pain, or other concerning findings. He has been watched here for multiple hours with no vomiting or worsening in his symptoms. His creatinine is 1.5, indicating some degree of mild dehydration. Given IV fluids. At this point he is well appearing and I feel stable for discharge home. His significant other indicates they did not pick up the Zofran is prescribed yesterday, they will do this now. Follow-up with urology and/or PCP for recheck of creatinine as well as  reevaluation.   Pricilla Loveless, MD 07/11/15 [phone]

## 2015-07-11 NOTE — Discharge Instructions (Signed)
Your Creatinine (kidney function) is 1.5. This indicates some dehydration. You will need this to be rechecked by urology when you follow up or by a primary doctor in about 1 week

## 2015-07-11 NOTE — ED Notes (Signed)
Pt was here last night, able to urinate, but now having abd pain, testicular pain, vomited x 3 with streaks of blood

## 2015-09-08 ENCOUNTER — Encounter (HOSPITAL_COMMUNITY): Payer: Self-pay | Admitting: *Deleted

## 2015-09-08 ENCOUNTER — Emergency Department (HOSPITAL_COMMUNITY): Payer: BLUE CROSS/BLUE SHIELD

## 2015-09-08 ENCOUNTER — Emergency Department (HOSPITAL_COMMUNITY)
Admission: EM | Admit: 2015-09-08 | Discharge: 2015-09-08 | Disposition: A | Discharge status: Home / Self Care | Payer: BLUE CROSS/BLUE SHIELD | Attending: Emergency Medicine | Admitting: Emergency Medicine

## 2015-09-08 DIAGNOSIS — Y998 Other external cause status: Secondary | ICD-10-CM | POA: Diagnosis not present

## 2015-09-08 DIAGNOSIS — Z792 Long term (current) use of antibiotics: Secondary | ICD-10-CM | POA: Diagnosis not present

## 2015-09-08 DIAGNOSIS — S82891A Other fracture of right lower leg, initial encounter for closed fracture: Secondary | ICD-10-CM

## 2015-09-08 DIAGNOSIS — Y92410 Unspecified street and highway as the place of occurrence of the external cause: Secondary | ICD-10-CM | POA: Insufficient documentation

## 2015-09-08 DIAGNOSIS — S8261XA Displaced fracture of lateral malleolus of right fibula, initial encounter for closed fracture: Secondary | ICD-10-CM | POA: Insufficient documentation

## 2015-09-08 DIAGNOSIS — Z87442 Personal history of urinary calculi: Secondary | ICD-10-CM | POA: Insufficient documentation

## 2015-09-08 DIAGNOSIS — Z720 Tobacco use: Secondary | ICD-10-CM | POA: Diagnosis not present

## 2015-09-08 DIAGNOSIS — S99911A Unspecified injury of right ankle, initial encounter: Secondary | ICD-10-CM | POA: Diagnosis present

## 2015-09-08 DIAGNOSIS — Y9389 Activity, other specified: Secondary | ICD-10-CM | POA: Diagnosis not present

## 2015-09-08 MED ORDER — HYDROMORPHONE HCL 1 MG/ML IJ SOLN
1.0000 mg | Freq: Once | INTRAMUSCULAR | Status: AC
  Administered 2015-09-08: 1 mg via INTRAVENOUS
  Filled 2015-09-08: qty 1

## 2015-09-08 MED ORDER — MORPHINE SULFATE (PF) 4 MG/ML IV SOLN
4.0000 mg | Freq: Once | INTRAVENOUS | Status: AC
  Administered 2015-09-08: 4 mg via INTRAVENOUS
  Filled 2015-09-08: qty 1

## 2015-09-08 MED ORDER — HYDROCODONE-ACETAMINOPHEN 5-325 MG PO TABS: 2.0000 | ORAL_TABLET | ORAL | Status: DC | PRN
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
## 2015-09-08 NOTE — Progress Notes (Signed)
Orthopedic Tech Progress Note Patient Details:  Sean Cruz [birth year] [id number] Applied fiberglass posterior splint and fiberglass stirrup splint to RLE.  Pulses, sensation, motion intact before and after splinting.  Capillary refill less than 2 seconds before and after splinting.  Fit pt. for crutches and taught use of same. Ortho Devices Type of Ortho Device: Crutches, Stirrup splint, Short leg splint Ortho Device/Splint Location: RLE Ortho Device/Splint Interventions: Application   Sean Cruz 09/08/2015, 8:51 PM

## 2015-09-08 NOTE — ED Notes (Signed)
Patient left at this time with all belongings. 

## 2015-09-08 NOTE — Discharge Instructions (Signed)
Ankle Fracture  A fracture is a break in a bone. The ankle joint is made up of three bones. These include the lower (distal)sections of your lower leg bones, called the tibia and fibula, along with a bone in your foot, called the talus. Depending on how bad the break is and if more than one ankle joint bone is broken, a cast or splint is used to protect and keep your injured bone from moving while it heals. Sometimes, surgery is required to help the fracture heal properly.   There are two general types of fractures:   Stable fracture. This includes a single fracture line through one bone, with no injury to ankle ligaments. A fracture of the talus that does not have any displacement (movement of the bone on either side of the fracture line) is also stable.   Unstable fracture. This includes more than one fracture line through one or more bones in the ankle joint. It also includes fractures that have displacement of the bone on either side of the fracture line.  CAUSES   A direct blow to the ankle.    Quickly and severely twisting your ankle.   Trauma, such as a car accident or falling from a significant height.  RISK FACTORS  You may be at a higher risk of ankle fracture if:   You have certain medical conditions.   You are involved in high-impact sports.   You are involved in a high-impact car accident.  SIGNS AND SYMPTOMS    Tender and swollen ankle.   Bruising around the injured ankle.   Pain on movement of the ankle.   Difficulty walking or putting weight on the ankle.   A cold foot below the site of the ankle injury. This can occur if the blood vessels passing through your injured ankle were also damaged.   Numbness in the foot below the site of the ankle injury.  DIAGNOSIS   An ankle fracture is usually diagnosed with a physical exam and X-rays. A CT scan may also be required for complex fractures.  TREATMENT   Stable fractures are treated with a cast or splint and using crutches to avoid putting  weight on your injured ankle. This is followed by an ankle strengthening program. Some patients require a special type of cast, depending on other medical problems they may have. Unstable fractures require surgery to ensure the bones heal properly. Your health care provider will tell you what type of fracture you have and the best treatment for your condition.  HOME CARE INSTRUCTIONS    Review correct crutch use with your health care provider and use your crutches as directed. Safe use of crutches is extremely important. Misuse of crutches can cause you to fall or cause injury to nerves in your hands or armpits.   Do not put weight or pressure on the injured ankle until directed by your health care provider.   To lessen the swelling, keep the injured leg elevated while sitting or lying down.   Apply ice to the injured area:   Put ice in a plastic bag.   Place a towel between your cast and the bag.   Leave the ice on for 20 minutes, 2-3 times a day.   If you have a plaster or fiberglass cast:   Do not try to scratch the skin under the cast with any objects. This can increase your risk of skin infection.   Check the skin around the cast every day. You   may put lotion on any red or sore areas.   Keep your cast dry and clean.   If you have a plaster splint:   Wear the splint as directed.   You may loosen the elastic around the splint if your toes become numb, tingle, or turn cold or blue.   Do not put pressure on any part of your cast or splint; it may break. Rest your cast only on a pillow the first 24 hours until it is fully hardened.   Your cast or splint can be protected during bathing with a plastic bag sealed to your skin with medical tape. Do not lower the cast or splint into water.   Take medicines as directed by your health care provider. Only take over-the-counter or prescription medicines for pain, discomfort, or fever as directed by your health care provider.   Do not drive a vehicle until  your health care provider specifically tells you it is safe to do so.   If your health care provider has given you a follow-up appointment, it is very important to keep that appointment. Not keeping the appointment could result in a chronic or permanent injury, pain, and disability. If you have any problem keeping the appointment, call the facility for assistance.  SEEK MEDICAL CARE IF:  You develop increased swelling or discomfort.  SEEK IMMEDIATE MEDICAL CARE IF:    Your cast gets damaged or breaks.   You have continued severe pain.   You develop new pain or swelling after the cast was put on.   Your skin or toenails below the injury turn blue or gray.   Your skin or toenails below the injury feel cold, numb, or have loss of sensitivity to touch.   There is a bad smell or pus draining from under the cast.  MAKE SURE YOU:    Understand these instructions.   Will watch your condition.   Will get help right away if you are not doing well or get worse.  Document Released: 12/08/2000 Document Revised: 12/16/2013 Document Reviewed: 07/10/2013  [employer] Patient Information 2015 [employer]. This information is not intended to replace advice given to you by your health care provider. Make sure you discuss any questions you have with your health care provider.
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
## 2016-06-19 ENCOUNTER — Emergency Department (HOSPITAL_BASED_OUTPATIENT_CLINIC_OR_DEPARTMENT_OTHER)
Admission: EM | Admit: 2016-06-19 | Discharge: 2016-06-20 | Disposition: A | Discharge status: Home / Self Care | Payer: BLUE CROSS/BLUE SHIELD | Attending: Emergency Medicine | Admitting: Emergency Medicine

## 2016-06-19 ENCOUNTER — Encounter (HOSPITAL_BASED_OUTPATIENT_CLINIC_OR_DEPARTMENT_OTHER): Payer: Self-pay | Admitting: Emergency Medicine

## 2016-06-19 DIAGNOSIS — S060X1A Concussion with loss of consciousness of 30 minutes or less, initial encounter: Secondary | ICD-10-CM | POA: Diagnosis not present

## 2016-06-19 DIAGNOSIS — S0990XA Unspecified injury of head, initial encounter: Secondary | ICD-10-CM | POA: Diagnosis present

## 2016-06-19 DIAGNOSIS — S161XXA Strain of muscle, fascia and tendon at neck level, initial encounter: Secondary | ICD-10-CM | POA: Diagnosis not present

## 2016-06-19 DIAGNOSIS — F17210 Nicotine dependence, cigarettes, uncomplicated: Secondary | ICD-10-CM | POA: Diagnosis not present

## 2016-06-19 DIAGNOSIS — Y929 Unspecified place or not applicable: Secondary | ICD-10-CM | POA: Diagnosis not present

## 2016-06-19 DIAGNOSIS — M545 Low back pain: Secondary | ICD-10-CM | POA: Diagnosis not present

## 2016-06-19 DIAGNOSIS — R079 Chest pain, unspecified: Secondary | ICD-10-CM | POA: Diagnosis not present

## 2016-06-19 DIAGNOSIS — Y9389 Activity, other specified: Secondary | ICD-10-CM | POA: Diagnosis not present

## 2016-06-19 DIAGNOSIS — Y999 Unspecified external cause status: Secondary | ICD-10-CM | POA: Insufficient documentation
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
## 2016-06-20 ENCOUNTER — Emergency Department (HOSPITAL_BASED_OUTPATIENT_CLINIC_OR_DEPARTMENT_OTHER): Payer: BLUE CROSS/BLUE SHIELD

## 2016-06-20 DIAGNOSIS — S060X1A Concussion with loss of consciousness of 30 minutes or less, initial encounter: Secondary | ICD-10-CM | POA: Diagnosis not present

## 2016-06-20 LAB — RAPID URINE DRUG SCREEN, HOSP PERFORMED
Amphetamines: POSITIVE — AB
BARBITURATES: NOT DETECTED
BENZODIAZEPINES: NOT DETECTED
Cocaine: NOT DETECTED
Opiates: NOT DETECTED
Tetrahydrocannabinol: POSITIVE — AB

## 2016-06-20 LAB — BASIC METABOLIC PANEL
Anion gap: 6 (ref 5–15)
BUN: 13 mg/dL (ref 6–20)
CO2: 23 mmol/L (ref 22–32)
Calcium: 8.6 mg/dL — ABNORMAL LOW (ref 8.9–10.3)
Chloride: 108 mmol/L (ref 101–111)
Creatinine, Ser: 0.73 mg/dL (ref 0.61–1.24)
GFR calc Af Amer: >60 mL/min (ref >60)
GFR calc non Af Amer: >60 mL/min (ref >60)
GLUCOSE: 93 mg/dL (ref 65–99)
POTASSIUM: 3.9 mmol/L (ref 3.5–5.1)
Sodium: 137 mmol/L (ref 135–145)

## 2016-06-20 LAB — CBC WITH DIFFERENTIAL/PLATELET
BASOS PCT: 1 %
Basophils Absolute: 0.1 10*3/uL (ref 0.0–0.1)
Eosinophils Absolute: 0.2 10*3/uL (ref 0.0–0.7)
Eosinophils Relative: 3 %
HEMATOCRIT: 41.3 % (ref 39.0–52.0)
HEMOGLOBIN: 14.1 g/dL (ref 13.0–17.0)
LYMPHS PCT: 27 %
Lymphs Abs: 2.3 10*3/uL (ref 0.7–4.0)
MCH: 30 pg (ref 26.0–34.0)
MCHC: 34.1 g/dL (ref 30.0–36.0)
MCV: 87.9 fL (ref 78.0–100.0)
Monocytes Absolute: 0.8 10*3/uL (ref 0.1–1.0)
Monocytes Relative: 10 %
NEUTROS ABS: 5.4 10*3/uL (ref 1.7–7.7)
NEUTROS PCT: 61 %
Platelets: 215 10*3/uL (ref 150–400)
RBC: 4.7 MIL/uL (ref 4.22–5.81)
RDW: 12.6 % (ref 11.5–15.5)
WBC: 8.8 10*3/uL (ref 4.0–10.5)

## 2016-06-20 LAB — ETHANOL

## 2016-06-20 MED ORDER — IBUPROFEN 600 MG PO TABS: 600.0000 mg | ORAL_TABLET | Freq: Four times a day (QID) | ORAL | Status: DC | PRN

## 2016-06-20 NOTE — ED Notes (Signed)
Upon entering room, pt sleeping soundly. Pt awakened to gentle touch. Pt given soup, drink and apple sauce for po challenge. Family at bedside to assist pt.

## 2016-06-20 NOTE — ED Notes (Signed)
Pt ate meal without difficulty. MD made aware.
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
## 2018-05-06 ENCOUNTER — Ambulatory Visit (HOSPITAL_COMMUNITY): Payer: BLUE CROSS/BLUE SHIELD | Admitting: Certified Registered"

## 2018-05-06 ENCOUNTER — Encounter (HOSPITAL_BASED_OUTPATIENT_CLINIC_OR_DEPARTMENT_OTHER): Payer: Self-pay | Admitting: *Deleted

## 2018-05-06 ENCOUNTER — Encounter (HOSPITAL_COMMUNITY)
Admission: EM | Disposition: A | Discharge status: Home / Self Care | Payer: Self-pay | Source: Home / Self Care | Attending: Emergency Medicine

## 2018-05-06 ENCOUNTER — Ambulatory Visit (HOSPITAL_COMMUNITY): Payer: BLUE CROSS/BLUE SHIELD

## 2018-05-06 ENCOUNTER — Ambulatory Visit (HOSPITAL_BASED_OUTPATIENT_CLINIC_OR_DEPARTMENT_OTHER)
Admission: EM | Admit: 2018-05-06 | Discharge: 2018-05-06 | Disposition: A | Discharge status: Home / Self Care | Payer: BLUE CROSS/BLUE SHIELD | Attending: Emergency Medicine | Admitting: Emergency Medicine

## 2018-05-06 ENCOUNTER — Ambulatory Visit (HOSPITAL_COMMUNITY)
Admission: EM | Admit: 2018-05-06 | Payer: BLUE CROSS/BLUE SHIELD | Source: Ambulatory Visit | Admitting: Orthopedic Surgery

## 2018-05-06 ENCOUNTER — Ambulatory Visit: Admit: 2018-05-06 | Payer: BLUE CROSS/BLUE SHIELD | Admitting: Orthopedic Surgery

## 2018-05-06 ENCOUNTER — Other Ambulatory Visit: Payer: Self-pay

## 2018-05-06 ENCOUNTER — Emergency Department (HOSPITAL_BASED_OUTPATIENT_CLINIC_OR_DEPARTMENT_OTHER): Payer: BLUE CROSS/BLUE SHIELD

## 2018-05-06 DIAGNOSIS — Z419 Encounter for procedure for purposes other than remedying health state, unspecified: Secondary | ICD-10-CM

## 2018-05-06 DIAGNOSIS — F17210 Nicotine dependence, cigarettes, uncomplicated: Secondary | ICD-10-CM | POA: Diagnosis not present

## 2018-05-06 DIAGNOSIS — Y9289 Other specified places as the place of occurrence of the external cause: Secondary | ICD-10-CM | POA: Insufficient documentation

## 2018-05-06 DIAGNOSIS — W208XXA Other cause of strike by thrown, projected or falling object, initial encounter: Secondary | ICD-10-CM | POA: Diagnosis not present

## 2018-05-06 DIAGNOSIS — Y990 Civilian activity done for income or pay: Secondary | ICD-10-CM | POA: Diagnosis not present

## 2018-05-06 DIAGNOSIS — W458XXA Other foreign body or object entering through skin, initial encounter: Secondary | ICD-10-CM | POA: Diagnosis not present

## 2018-05-06 DIAGNOSIS — S62521B Displaced fracture of distal phalanx of right thumb, initial encounter for open fracture: Secondary | ICD-10-CM | POA: Insufficient documentation

## 2018-05-06 HISTORY — PX: AMPUTATION: SHX166

## 2018-05-06 LAB — CBC WITH DIFFERENTIAL/PLATELET
BASOS PCT: 1 %
Basophils Absolute: 0.1 10*3/uL (ref 0.0–0.1)
Eosinophils Absolute: 0.3 10*3/uL (ref 0.0–0.7)
Eosinophils Relative: 4 %
HEMATOCRIT: 42.2 % (ref 39.0–52.0)
Hemoglobin: 15.2 g/dL (ref 13.0–17.0)
Lymphocytes Relative: 38 %
Lymphs Abs: 2.6 10*3/uL (ref 0.7–4.0)
MCH: 31.7 pg (ref 26.0–34.0)
MCHC: 36 g/dL (ref 30.0–36.0)
MCV: 87.9 fL (ref 78.0–100.0)
MONO ABS: 0.6 10*3/uL (ref 0.1–1.0)
MONOS PCT: 8 %
NEUTROS ABS: 3.5 10*3/uL (ref 1.7–7.7)
Neutrophils Relative %: 49 %
Platelets: 225 10*3/uL (ref 150–400)
RBC: 4.8 MIL/uL (ref 4.22–5.81)
RDW: 11.8 % (ref 11.5–15.5)
WBC: 7 10*3/uL (ref 4.0–10.5)

## 2018-05-06 LAB — BASIC METABOLIC PANEL
Anion gap: 5 (ref 5–15)
BUN: 14 mg/dL (ref 6–20)
CALCIUM: 8.9 mg/dL (ref 8.9–10.3)
CO2: 24 mmol/L (ref 22–32)
Chloride: 105 mmol/L (ref 101–111)
Creatinine, Ser: 0.91 mg/dL (ref 0.61–1.24)
GFR calc Af Amer: >60 mL/min (ref >60)
GFR calc non Af Amer: >60 mL/min (ref >60)
GLUCOSE: 104 mg/dL — AB (ref 65–99)
Potassium: 4.2 mmol/L (ref 3.5–5.1)
Sodium: 134 mmol/L — ABNORMAL LOW (ref 135–145)

## 2018-05-06 SURGERY — AMPUTATION DIGIT
Anesthesia: General | Site: Thumb | Laterality: Right

## 2018-05-06 MED ORDER — TETANUS-DIPHTH-ACELL PERTUSSIS 5-2.5-18.5 LF-MCG/0.5 IM SUSP
0.5000 mL | Freq: Once | INTRAMUSCULAR | Status: AC
  Administered 2018-05-06: 0.5 mL via INTRAMUSCULAR
  Filled 2018-05-06: qty 0.5

## 2018-05-06 MED ORDER — MIDAZOLAM HCL 2 MG/2ML IJ SOLN
INTRAMUSCULAR | Status: AC
  Filled 2018-05-06: qty 2

## 2018-05-06 MED ORDER — FENTANYL CITRATE (PF) 100 MCG/2ML IJ SOLN
100.0000 ug | Freq: Once | INTRAMUSCULAR | Status: AC
  Administered 2018-05-06: 100 ug via INTRAVENOUS
  Filled 2018-05-06: qty 2

## 2018-05-06 MED ORDER — IBUPROFEN 200 MG PO TABS: 600.0000 mg | ORAL_TABLET | Freq: Four times a day (QID) | ORAL | Status: AC

## 2018-05-06 MED ORDER — PROPOFOL 10 MG/ML IV BOLUS
INTRAVENOUS | Status: AC
  Filled 2018-05-06: qty 20

## 2018-05-06 MED ORDER — LIDOCAINE HCL (PF) 1 % IJ SOLN
INTRAMUSCULAR | Status: AC
  Filled 2018-05-06: qty 30

## 2018-05-06 MED ORDER — FENTANYL CITRATE (PF) 100 MCG/2ML IJ SOLN
INTRAMUSCULAR | Status: AC
  Filled 2018-05-06: qty 2

## 2018-05-06 MED ORDER — LIDOCAINE HCL (CARDIAC) PF 100 MG/5ML IV SOSY
PREFILLED_SYRINGE | INTRAVENOUS | Status: DC | PRN
  Administered 2018-05-06: 60 mg via INTRATRACHEAL

## 2018-05-06 MED ORDER — FENTANYL CITRATE (PF) 250 MCG/5ML IJ SOLN
INTRAMUSCULAR | Status: AC
  Filled 2018-05-06: qty 5

## 2018-05-06 MED ORDER — ONDANSETRON HCL 4 MG/2ML IJ SOLN
4.0000 mg | Freq: Once | INTRAMUSCULAR | Status: AC
  Administered 2018-05-06: 4 mg via INTRAVENOUS

## 2018-05-06 MED ORDER — PROPOFOL 10 MG/ML IV BOLUS
INTRAVENOUS | Status: DC | PRN
  Administered 2018-05-06: 160 mg via INTRAVENOUS
  Administered 2018-05-06: 40 mg via INTRAVENOUS

## 2018-05-06 MED ORDER — MORPHINE SULFATE (PF) 4 MG/ML IV SOLN
4.0000 mg | Freq: Once | INTRAVENOUS | Status: AC
  Administered 2018-05-06: 4 mg via INTRAVENOUS

## 2018-05-06 MED ORDER — ONDANSETRON HCL 4 MG/2ML IJ SOLN
INTRAMUSCULAR | Status: AC
  Filled 2018-05-06: qty 2

## 2018-05-06 MED ORDER — BUPIVACAINE-EPINEPHRINE (PF) 0.5% -1:200000 IJ SOLN
INTRAMUSCULAR | Status: AC
  Filled 2018-05-06: qty 30

## 2018-05-06 MED ORDER — CEPHALEXIN 500 MG PO CAPS: 500.0000 mg | ORAL_CAPSULE | Freq: Four times a day (QID) | ORAL | 0 refills | Status: AC

## 2018-05-06 MED ORDER — SUCCINYLCHOLINE CHLORIDE 20 MG/ML IJ SOLN
INTRAMUSCULAR | Status: DC | PRN
  Administered 2018-05-06: 100 mg via INTRAVENOUS

## 2018-05-06 MED ORDER — FENTANYL CITRATE (PF) 100 MCG/2ML IJ SOLN
INTRAMUSCULAR | Status: DC | PRN
  Administered 2018-05-06 (×4): 50 ug via INTRAVENOUS
  Administered 2018-05-06: 25 ug via INTRAVENOUS
  Administered 2018-05-06 (×3): 50 ug via INTRAVENOUS
  Administered 2018-05-06: 25 ug via INTRAVENOUS
  Administered 2018-05-06: 50 ug via INTRAVENOUS

## 2018-05-06 MED ORDER — MIDAZOLAM HCL 2 MG/2ML IJ SOLN
INTRAMUSCULAR | Status: DC | PRN
  Administered 2018-05-06: 2 mg via INTRAVENOUS
  Administered 2018-05-06: 0.5 mg via INTRAVENOUS
  Administered 2018-05-06: .5 mg via INTRAVENOUS
  Administered 2018-05-06: 1 mg via INTRAVENOUS

## 2018-05-06 MED ORDER — ACETAMINOPHEN 325 MG PO TABS: 650.0000 mg | ORAL_TABLET | Freq: Four times a day (QID) | ORAL | Status: AC

## 2018-05-06 MED ORDER — HYDROMORPHONE HCL 2 MG/ML IJ SOLN: 0.3000 mg | INTRAMUSCULAR | Status: DC | PRN

## 2018-05-06 MED ORDER — BACITRACIN ZINC 500 UNIT/GM EX OINT
TOPICAL_OINTMENT | CUTANEOUS | Status: AC
  Filled 2018-05-06: qty 28.35

## 2018-05-06 MED ORDER — MORPHINE SULFATE (PF) 4 MG/ML IV SOLN
INTRAVENOUS | Status: AC
  Filled 2018-05-06: qty 1

## 2018-05-06 MED ORDER — CEFAZOLIN SODIUM-DEXTROSE 1-4 GM/50ML-% IV SOLN
1.0000 g | Freq: Once | INTRAVENOUS | Status: AC
  Administered 2018-05-06: 1 g via INTRAVENOUS
  Filled 2018-05-06: qty 50

## 2018-05-06 MED ORDER — SODIUM CHLORIDE 0.9 % IV BOLUS: 1000.0000 mL | Freq: Once | INTRAVENOUS | Status: DC

## 2018-05-06 MED ORDER — ONDANSETRON HCL 4 MG/2ML IJ SOLN
INTRAMUSCULAR | Status: DC | PRN
  Administered 2018-05-06: 4 mg via INTRAVENOUS

## 2018-05-06 MED ORDER — LACTATED RINGERS IV SOLN
INTRAVENOUS | Status: DC | PRN
  Administered 2018-05-06 (×2): via INTRAVENOUS

## 2018-05-06 MED ORDER — 0.9 % SODIUM CHLORIDE (POUR BTL) OPTIME
TOPICAL | Status: DC | PRN
  Administered 2018-05-06: 1000 mL

## 2018-05-06 MED ORDER — OXYCODONE HCL 5 MG PO TABS: 5.0000 mg | ORAL_TABLET | Freq: Four times a day (QID) | ORAL | 0 refills | Status: DC | PRN

## 2018-05-06 MED ORDER — PROMETHAZINE HCL 25 MG/ML IJ SOLN: 6.2500 mg | INTRAMUSCULAR | Status: DC | PRN

## 2018-05-06 MED ORDER — BUPIVACAINE-EPINEPHRINE 0.5% -1:200000 IJ SOLN
INTRAMUSCULAR | Status: DC | PRN
  Administered 2018-05-06: 6 mL

## 2018-05-06 MED ORDER — CEFAZOLIN SODIUM-DEXTROSE 2-3 GM-%(50ML) IV SOLR
INTRAVENOUS | Status: DC | PRN
  Administered 2018-05-06: 2 g via INTRAVENOUS

## 2018-05-06 SURGICAL SUPPLY — 43 items
BANDAGE COBAN STERILE 2 (GAUZE/BANDAGES/DRESSINGS) IMPLANT
BLADE AVERAGE 25MMX9MM (BLADE)
BLADE AVERAGE 25X9 (BLADE) IMPLANT
BNDG CMPR 9X4 STRL LF SNTH (GAUZE/BANDAGES/DRESSINGS)
BNDG COHESIVE 1X5 TAN STRL LF (GAUZE/BANDAGES/DRESSINGS) ×2 IMPLANT
BNDG COHESIVE 4X5 TAN NS LF (GAUZE/BANDAGES/DRESSINGS) IMPLANT
BNDG CONFORM 2 STRL LF (GAUZE/BANDAGES/DRESSINGS) ×2 IMPLANT
BNDG CONFORM 3 STRL LF (GAUZE/BANDAGES/DRESSINGS) ×3 IMPLANT
BNDG ELASTIC 2X5.8 VLCR STR LF (GAUZE/BANDAGES/DRESSINGS) IMPLANT
BNDG ESMARK 4X9 LF (GAUZE/BANDAGES/DRESSINGS) IMPLANT
BNDG GAUZE ELAST 4 BULKY (GAUZE/BANDAGES/DRESSINGS) ×6 IMPLANT
CHLORAPREP W/TINT 26ML (MISCELLANEOUS) ×3 IMPLANT
CORDS BIPOLAR (ELECTRODE) ×3 IMPLANT
COVER SURGICAL LIGHT HANDLE (MISCELLANEOUS) ×2 IMPLANT
DRAPE C-ARM 42X72 X-RAY (DRAPES) ×2 IMPLANT
DRAPE SURG 17X23 STRL (DRAPES) ×3 IMPLANT
DRSG EMULSION OIL 3X3 NADH (GAUZE/BANDAGES/DRESSINGS) ×3 IMPLANT
ELECT REM PT RETURN 9FT ADLT (ELECTROSURGICAL) ×3
ELECTRODE REM PT RTRN 9FT ADLT (ELECTROSURGICAL) ×1 IMPLANT
GAUZE SPONGE 4X4 12PLY STRL (GAUZE/BANDAGES/DRESSINGS) ×3 IMPLANT
GAUZE XEROFORM 1X8 LF (GAUZE/BANDAGES/DRESSINGS) ×3 IMPLANT
GLOVE BIO SURGEON STRL SZ7.5 (GLOVE) ×3 IMPLANT
GLOVE BIOGEL PI IND STRL 8 (GLOVE) ×1 IMPLANT
GLOVE BIOGEL PI INDICATOR 8 (GLOVE) ×2
GOWN STRL REUS W/ TWL XL LVL3 (GOWN DISPOSABLE) ×1 IMPLANT
GOWN STRL REUS W/TWL XL LVL3 (GOWN DISPOSABLE) ×3
KIT BASIN OR (CUSTOM PROCEDURE TRAY) ×3 IMPLANT
KWIRE ×4 IMPLANT
NDL HYPO 25X1 1.5 SAFETY (NEEDLE) IMPLANT
NEEDLE HYPO 25X1 1.5 SAFETY (NEEDLE) IMPLANT
PACK ORTHO EXTREMITY (CUSTOM PROCEDURE TRAY) ×3 IMPLANT
PAD CAST 4YDX4 CTTN HI CHSV (CAST SUPPLIES) IMPLANT
PADDING CAST ABS 4INX4YD NS (CAST SUPPLIES) ×2
PADDING CAST ABS COTTON 4X4 ST (CAST SUPPLIES) ×1 IMPLANT
PADDING CAST COTTON 4X4 STRL (CAST SUPPLIES)
PENCIL BUTTON HOLSTER BLD 10FT (ELECTRODE) IMPLANT
SUT CHROMIC 5 0 P 3 (SUTURE) ×2 IMPLANT
SUT ETHILON 4 0 PS 2 18 (SUTURE) IMPLANT
SUT MNCRL AB 4-0 PS2 18 (SUTURE) IMPLANT
SUT VICRYL RAPIDE 4/0 PS 2 (SUTURE) ×2 IMPLANT
SYR 10ML LL (SYRINGE) IMPLANT
TOWEL OR 17X24 6PK STRL BLUE (TOWEL DISPOSABLE) ×3 IMPLANT
UNDERPAD 30X30 (UNDERPADS AND DIAPERS) ×3 IMPLANT

## 2018-05-06 NOTE — Anesthesia Procedure Notes (Signed)
Procedure Name: Intubation Date/Time: 05/06/2018 8:10 PM Performed by: Claudina Lick, CRNA Pre-anesthesia Checklist: Patient identified, Emergency Drugs available, Suction available, Patient being monitored and Timeout performed Patient Re-evaluated:Patient Re-evaluated prior to induction Oxygen Delivery Method: Circle system utilized Preoxygenation: Pre-oxygenation with 100% oxygen Induction Type: IV induction, Rapid sequence and Cricoid Pressure applied Laryngoscope Size: Miller and 2 Grade View: Grade I Tube type: Oral Tube size: 7.5 mm Number of attempts: 1 Airway Equipment and Method: Stylet Placement Confirmation: ETT inserted through vocal cords under direct vision,  positive ETCO2 and breath sounds checked- equal and bilateral Secured at: 22 cm Tube secured with: Tape Dental Injury: Teeth and Oropharynx as per pre-operative assessment

## 2018-05-06 NOTE — Consult Note (Signed)
ORTHOPAEDIC CONSULTATION HISTORY & PHYSICAL REQUESTING PHYSICIAN: Shaune Pollack, MD  Chief Complaint: right thumb injury  HPI: Sean Cruz is a 27 y.o. male who injured his R thumb today, causing incomplete amputation of the tip.  He was evaluated at [facility] and transferred to [facility] for more definitive management.  NPO all day.  Past Medical History:  Diagnosis Date  . Kidney stones    Past Surgical History:  Procedure Laterality Date  . APPENDECTOMY    . CIRCUMCISION     Social History   Socioeconomic History  . Marital status: Single    Spouse name: Not on file  . Number of children: Not on file  . Years of education: Not on file  . Highest education level: Not on file  Occupational History  . Not on file  Social Needs  . Financial resource strain: Not on file  . Food insecurity:    Worry: Not on file    Inability: Not on file  . Transportation needs:    Medical: Not on file    Non-medical: Not on file  Tobacco Use  . Smoking status: Current Every Day Smoker    Packs/day: 1.00    Types: Cigarettes  . Smokeless tobacco: Current User  Substance and Sexual Activity  . Alcohol use: Yes  . Drug use: No  . Sexual activity: Yes  Lifestyle  . Physical activity:    Days per week: Not on file    Minutes per session: Not on file  . Stress: Not on file  Relationships  . Social connections:    Talks on phone: Not on file    Gets together: Not on file    Attends religious service: Not on file    Active member of club or organization: Not on file    Attends meetings of clubs or organizations: Not on file    Relationship status: Not on file  Other Topics Concern  . Not on file  Social History Narrative  . Not on file   History reviewed. No pertinent family history. Allergies  Allergen Reactions  . Prednisone Rash   Prior to Admission medications   Medication Sig Start Date End Date Taking? Authorizing Provider  divalproex (DEPAKOTE) 500 MG DR tablet Take 1  tablet (500 mg total) by mouth 2 (two) times daily. Patient not taking: Reported on 01/03/2015 11/11/14   Trixie Dredge, PA-C  HYDROcodone-acetaminophen (NORCO/VICODIN) 5-325 MG per tablet Take 2 tablets by mouth every 4 (four) hours as needed. 09/08/15   Beverely Risen, MD  ibuprofen (ADVIL,MOTRIN) 600 MG tablet Take 1 tablet (600 mg total) by mouth every 6 (six) hours as needed. 06/20/16   Horton, Mayer Masker, MD  levETIRAcetam (KEPPRA) 500 MG tablet Take 1 tablet (500 mg total) by mouth 2 (two) times daily. Patient not taking: Reported on 01/03/2015 11/11/14   Trixie Dredge, [place]   Dg Finger Thumb Right  Result Date: 05/06/2018 CLINICAL DATA:  The patient suffered a laceration of the right thumb on a saw today. Initial encounter. EXAM: RIGHT THUMB 2+V COMPARISON:  None. FINDINGS: Large laceration of the distal thumb extends from just volar to the nail bled through the base of the distal phalanx. The fracture fragment of the distal phalanx is anteriorly displaced approximately 1 cm. The articular surface at the IP joint does not appear disrupted. No radiopaque foreign body is identified. Bandaging is in place. IMPRESSION: Large laceration of the left thumb extends through the distal phalanx as described above. Electronically Signed  By: Drusilla Kanner M.D.   On: 05/06/2018 15:16    Positive ROS: All other systems have been reviewed and were otherwise negative with the exception of those mentioned in the HPI and as above.  Physical Exam: Vitals: Refer to EMR. Constitutional:  WD, WN, NAD HEENT:  NCAT, EOMI Neuro/Psych:  Alert & oriented to person, place, and time; appropriate mood & affect Lymphatic: No generalized extremity edema or lymphadenopathy Extremities / MSK:  The extremities are normal with respect to appearance, ranges of motion, joint stability, muscle strength/tone, sensation, & perfusion except as otherwise noted:  Right thumb is bandaged.  Clinical photographs are reviewed.  There is a  fishmouth laceration wrapping around the tip of the thumb, with substantial palmar flap.  The volar portion of the distal phalanx is with the palmar flap, the dorsal portion remains with a dorsal flap.  However, the fracture does not appear to be intra-articular, with an intact IP joint.  The nail complex appears to be intact on the dorsal flap.  Assessment: Right thumb complex multi-tissue injury with incomplete ampuation, essentially a coronal division of the tip/pulp just deep to the nailplate, dividing the distal phalanx, etc.  Plan: To OR for definitive management, with debridement/irrigation, skeletal fixation, etc.to reduce risk for infection and optimize functional recovery.  G/R/O reviewed and consent obtained.  Cliffton Asters Janee Morn, MD      Orthopaedic & Hand Surgery [facility] [address] [place], [place]  [address] Office: [phone] Mobile: [phone]  05/06/2018, 4:06 PM
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
## 2018-05-06 NOTE — Discharge Instructions (Signed)
Discharge Instructions   You have a dressing with a plaster splint incorporated in it. Move your fingers as much as possible, making a full fist and fully opening the fist. Elevate your hand to reduce pain & swelling of the digits.  Ice over the operative site may be helpful to reduce pain & swelling.  DO NOT USE HEAT. Pain medicine has been prescribed for you.  Leave the dressing in place until you return to our office.  You may shower, but keep the bandage clean & dry.  You may drive a car when you are off of prescription pain medications and can safely control your vehicle with both hands. Our office will call you to arrange follow-up   Please call [phone] during normal business hours or [phone] after hours for any problems. Including the following:  - excessive redness of the incisions - drainage for more than 4 days - fever of more than 101.5 F  *Please note that pain medications will not be refilled after hours or on weekends.  

## 2018-05-06 NOTE — ED Notes (Signed)
Family at bedside. 

## 2018-05-06 NOTE — ED Notes (Signed)
Patient transported to X-ray 

## 2018-05-06 NOTE — ED Notes (Signed)
Report given to [place] Geophysicist/field seismologist at [facility] ED

## 2018-05-06 NOTE — Transfer of Care (Signed)
Immediate Anesthesia Transfer of Care Note  Patient: [facility]  Procedure(s) Performed: RIGHT THUMB AMPUTATION REPAIR (Right Thumb)  Patient Location: PACU  Anesthesia Type:General  Level of Consciousness: drowsy  Airway & Oxygen Therapy: Patient Spontanous Breathing and Patient connected to face mask oxygen  Post-op Assessment: Report given to RN and Post -op Vital signs reviewed and stable  Post vital signs: Reviewed and stable  Last Vitals:  Vitals Value Taken Time  BP    Temp    Pulse    Resp    SpO2      Last Pain:  Vitals:   05/06/18 1439  TempSrc: Oral  PainSc:          Complications: No apparent anesthesia complications

## 2018-05-06 NOTE — ED Triage Notes (Addendum)
Pt c/o lac to right thumb x 15 mins , partial amputation with bleeding noted, rings removed , pt pale in color and diaphoretic, dizzy

## 2018-05-06 NOTE — Anesthesia Preprocedure Evaluation (Addendum)
Anesthesia Evaluation  Patient identified by MRN, date of birth, ID band Patient awake    Reviewed: Allergy & Precautions, NPO status , Patient's Chart, lab work & pertinent test results  History of Anesthesia Complications (+) PONV  Airway Mallampati: II  TM Distance: >3 FB Neck ROM: Full    Dental  (+) Dental Advisory Given   Pulmonary Current Smoker,    breath sounds clear to auscultation       Cardiovascular negative cardio ROS   Rhythm:Regular Rate:Normal     Neuro/Psych negative neurological ROS     GI/Hepatic negative GI ROS, Neg liver ROS,   Endo/Other  negative endocrine ROS  Renal/GU negative Renal ROS     Musculoskeletal   Abdominal   Peds  Hematology negative hematology ROS (+)   Anesthesia Other Findings   Reproductive/Obstetrics                            Lab Results  Component Value Date   WBC 7.0 05/06/2018   HGB 15.2 05/06/2018   HCT 42.2 05/06/2018   MCV 87.9 05/06/2018   PLT 225 05/06/2018   Lab Results  Component Value Date   CREATININE 0.91 05/06/2018   BUN 14 05/06/2018   NA 134 (L) 05/06/2018   K 4.2 05/06/2018   CL 105 05/06/2018   CO2 24 05/06/2018    Anesthesia Physical Anesthesia Plan  ASA: II  Anesthesia Plan: General   Post-op Pain Management:    Induction: Intravenous and Rapid sequence  PONV Risk Score and Plan: 2 and Ondansetron, Dexamethasone and Treatment may vary due to age or medical condition  Airway Management Planned: Oral ETT  Additional Equipment:   Intra-op Plan:   Post-operative Plan: Extubation in OR  Informed Consent: I have reviewed the patients History and Physical, chart, labs and discussed the procedure including the risks, benefits and alternatives for the proposed anesthesia with the patient or authorized representative who has indicated his/her understanding and acceptance.   Dental advisory given  Plan  Discussed with: CRNA  Anesthesia Plan Comments:        Anesthesia Quick Evaluation

## 2018-05-06 NOTE — Op Note (Signed)
05/06/2018  8:09 PM  PATIENT:  [facility]  27 y.o. male  PRE-OPERATIVE DIAGNOSIS:  Right thumb incomplete amputation with distal phalanx fracture  POST-OPERATIVE DIAGNOSIS:  Same  PROCEDURE:   1.  Excisional debridement (skin, subcutaneous tissues, bone) right thumb open distal phalanx fracture    2.  ORIF right thumb open distal phalanx fracture    3.  Right thumb removal of nail plate    4.  Right thumb nailbed repair    5.   Intermediate repair right thumb laceration 5 cm  SURGEON: Cliffton Asters. Janee Morn, MD  PHYSICIAN ASSISTANT: None  ANESTHESIA:  general  SPECIMENS:  None  DRAINS:   None  EBL:  less than 50 mL  PREOPERATIVE INDICATIONS:  Sean Cruz is a  27 y.o. male with complex open right thumb tip injury  The risks benefits and alternatives were discussed with the patient preoperatively including but not limited to the risks of infection, bleeding, nerve injury, cardiopulmonary complications, the need for revision surgery, among others, and the patient verbalized understanding and consented to proceed.  OPERATIVE IMPLANTS: 0.045 inch K wires x2  OPERATIVE PROCEDURE:  After receiving prophylactic antibiotics, the patient was escorted to the operative theatre and placed in a supine position.  General anesthesia was administered A surgical "time-out" was performed during which the planned procedure, proposed operative site, and the correct patient identity were compared to the operative consent and agreement confirmed by the circulating nurse according to current facility policy.  Following application of a tourniquet to the operative extremity, it was inflated to 250 mmHg to control bleeding and the exposed skin was pre-scrub with Hibiclens before being formally prepped with Betadine and draped in the usual sterile fashion.    We started with further excisional debridement, irrigation, and gross debris removal of the right thumb.  Devitalized skin edges were excisionally  debrided with scissors and forceps, to include subcutaneous tissues and a couple small bone fragments.  There was black particulate debris within the wound, and this was removed through manual means, irrigation, and using a scrub brush.  Once thought to be adequately cleansed, attention was directed to repair of structures as indicated.  The nail plate was removed from the nailbed using a freer elevator.  The fracture was reduced and secured with 2 parallel 0.045 inch K wires, both crossing the IP joint.  Fracture reduction was judged to be near-anatomic.  Once the fracture was stabilized, the fishmouth laceration about the thumb tip was reapproximated with 4-0 Vicryl Rapide interrupted sutures, and the longitudinal split in the nail bed near its ulnar side was repaired with 6-0 chromic interrupted sutures.  These repairs were facilitated with the use of 4 power loupe magnification worn throughout the case.  Xeroform was then placed into the nail fold as a stent, the K wires were clipped and bent over the tip onto the dorsum of the thumb.  Half percent Marcaine with epinephrine was instilled about the base of the thumb for postoperative pain control and the tourniquet was released.  Additional hemostasis was obtained with direct pressure, and then a thumb dressing was applied with a radial tongue blade component, looping around the wrist to prevent its dislodgment.  He was awakened and taken to the recovery room in stable condition, breathing spontaneously  DISPOSITION: He will be discharged home today with oral antibiotics, analgesic plan and the office will contact him for follow-up, likely in approximately a week, at which time we should get new x-rays of  the right thumb out of the postoperative splint dressing and decide how best to continue, likely with a aluminum splint that can wrap around the thumb from radial to ulnar.

## 2018-05-06 NOTE — ED Notes (Signed)
MD Provider at bedside. 

## 2018-05-07 NOTE — Anesthesia Postprocedure Evaluation (Signed)
Anesthesia Post Note  Patient: [facility]  Procedure(s) Performed: RIGHT THUMB AMPUTATION REPAIR (Right Thumb)     Patient location during evaluation: PACU Anesthesia Type: General Level of consciousness: awake and alert Pain management: pain level controlled Vital Signs Assessment: post-procedure vital signs reviewed and stable Respiratory status: spontaneous breathing, nonlabored ventilation, respiratory function stable and patient connected to nasal cannula oxygen Cardiovascular status: blood pressure returned to baseline and stable Postop Assessment: no apparent nausea or vomiting Anesthetic complications: no    Last Vitals:  Vitals:   05/06/18 2208 05/06/18 2223  BP: 122/72 117/75  Pulse:    Resp: 18 18  Temp:  37 C  SpO2: 100% 100%    Last Pain:  Vitals:   05/06/18 2210  TempSrc:   PainSc: 0-No pain                 Kennieth Rad

## 2018-05-08 ENCOUNTER — Encounter (HOSPITAL_COMMUNITY): Payer: Self-pay | Admitting: Orthopedic Surgery

## 2024-07-01 ENCOUNTER — Ambulatory Visit
Admission: EM | Admit: 2024-07-01 | Discharge: 2024-07-01 | Disposition: A | Discharge status: Home / Self Care | Source: Ambulatory Visit | Attending: Family Medicine | Admitting: Family Medicine

## 2024-07-01 DIAGNOSIS — R21 Rash and other nonspecific skin eruption: Secondary | ICD-10-CM | POA: Diagnosis not present

## 2024-07-01 DIAGNOSIS — L03115 Cellulitis of right lower limb: Secondary | ICD-10-CM | POA: Diagnosis not present

## 2024-07-01 DIAGNOSIS — M25561 Pain in right knee: Secondary | ICD-10-CM

## 2024-07-01 MED ORDER — OXYCODONE-ACETAMINOPHEN 5-325 MG PO TABS: 1.0000 | ORAL_TABLET | Freq: Three times a day (TID) | ORAL | 0 refills | Status: AC | PRN

## 2024-07-01 MED ORDER — DOXYCYCLINE HYCLATE 100 MG PO CAPS: 100.0000 mg | ORAL_CAPSULE | Freq: Two times a day (BID) | ORAL | 0 refills | Status: AC

## 2024-07-01 MED ORDER — CEFTRIAXONE SODIUM 1 G IJ SOLR
1000.0000 mg | Freq: Once | INTRAMUSCULAR | Status: AC
  Administered 2024-07-01: 1000 mg via INTRAMUSCULAR

## 2024-07-01 NOTE — Discharge Instructions (Addendum)
 Advised patient take medication as directed with food to completion.  Advised patient may take Percocet daily or as needed for acute/severe right knee pain.  Advised patient of sedative effects of this medication.  Encouraged increase daily water intake to 64 ounces per day while taking these medications.  Advised if symptoms worsen and/or unresolved please follow-up with your PCP or here for further evaluation.
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
